# Patient Record
Sex: Female | Born: 1970 | Race: White | Hispanic: No | Marital: Married | Smoking: Former smoker
Health system: Southern US, Community
[De-identification: ages and names within clinical notes are randomized; demographics above are authoritative.]

## PROBLEM LIST (undated history)

## (undated) DIAGNOSIS — F419 Anxiety disorder, unspecified: Secondary | ICD-10-CM

## (undated) DIAGNOSIS — A6920 Lyme disease, unspecified: Secondary | ICD-10-CM

## (undated) DIAGNOSIS — R000 Tachycardia, unspecified: Secondary | ICD-10-CM

## (undated) DIAGNOSIS — E041 Nontoxic single thyroid nodule: Secondary | ICD-10-CM

## (undated) DIAGNOSIS — Z8619 Personal history of other infectious and parasitic diseases: Secondary | ICD-10-CM

## (undated) DIAGNOSIS — T7840XA Allergy, unspecified, initial encounter: Secondary | ICD-10-CM

## (undated) DIAGNOSIS — G43909 Migraine, unspecified, not intractable, without status migrainosus: Secondary | ICD-10-CM

## (undated) HISTORY — PX: TONSILLECTOMY: SUR1361

## (undated) HISTORY — PX: APPENDECTOMY: SHX54

## (undated) HISTORY — PX: STAPEDES SURGERY: SHX789

## (undated) HISTORY — DX: Migraine, unspecified, not intractable, without status migrainosus: G43.909

## (undated) HISTORY — DX: Tachycardia, unspecified: R00.0

## (undated) HISTORY — DX: Personal history of other infectious and parasitic diseases: Z86.19

## (undated) HISTORY — DX: Allergy, unspecified, initial encounter: T78.40XA

## (undated) HISTORY — DX: Nontoxic single thyroid nodule: E04.1

---

## 2013-03-06 LAB — HM PAP SMEAR: HM Pap smear: NORMAL

## 2015-05-01 ENCOUNTER — Emergency Department (HOSPITAL_COMMUNITY)

## 2015-05-01 ENCOUNTER — Emergency Department (HOSPITAL_COMMUNITY)
Admission: EM | Admit: 2015-05-01 | Discharge: 2015-05-01 | Disposition: A | Discharge status: Home / Self Care | Attending: Emergency Medicine | Admitting: Emergency Medicine

## 2015-05-01 ENCOUNTER — Encounter (HOSPITAL_COMMUNITY): Payer: Self-pay | Admitting: Oncology

## 2015-05-01 DIAGNOSIS — K30 Functional dyspepsia: Secondary | ICD-10-CM | POA: Insufficient documentation

## 2015-05-01 DIAGNOSIS — R079 Chest pain, unspecified: Secondary | ICD-10-CM | POA: Diagnosis present

## 2015-05-01 DIAGNOSIS — R000 Tachycardia, unspecified: Secondary | ICD-10-CM | POA: Insufficient documentation

## 2015-05-01 DIAGNOSIS — Z87891 Personal history of nicotine dependence: Secondary | ICD-10-CM | POA: Diagnosis not present

## 2015-05-01 DIAGNOSIS — Z79899 Other long term (current) drug therapy: Secondary | ICD-10-CM | POA: Diagnosis not present

## 2015-05-01 DIAGNOSIS — Z8659 Personal history of other mental and behavioral disorders: Secondary | ICD-10-CM | POA: Diagnosis not present

## 2015-05-01 HISTORY — DX: Anxiety disorder, unspecified: F41.9

## 2015-05-01 LAB — BASIC METABOLIC PANEL
Anion gap: 9 (ref 5–15)
BUN: 17 mg/dL (ref 6–20)
CO2: 21 mmol/L — ABNORMAL LOW (ref 22–32)
CREATININE: 0.76 mg/dL (ref 0.44–1.00)
Calcium: 10.1 mg/dL (ref 8.9–10.3)
Chloride: 106 mmol/L (ref 101–111)
Glucose, Bld: 92 mg/dL (ref 65–99)
Potassium: 4.3 mmol/L (ref 3.5–5.1)
SODIUM: 136 mmol/L (ref 135–145)

## 2015-05-01 LAB — CBC
HCT: 44.5 % (ref 36.0–46.0)
Hemoglobin: 15 g/dL (ref 12.0–15.0)
MCH: 32.2 pg (ref 26.0–34.0)
MCHC: 33.7 g/dL (ref 30.0–36.0)
MCV: 95.5 fL (ref 78.0–100.0)
PLATELETS: 374 10*3/uL (ref 150–400)
RBC: 4.66 MIL/uL (ref 3.87–5.11)
RDW: 13 % (ref 11.5–15.5)
WBC: 7 10*3/uL (ref 4.0–10.5)

## 2015-05-01 LAB — I-STAT TROPONIN, ED
TROPONIN I, POC: 0.01 ng/mL (ref 0.00–0.08)
TROPONIN I, POC: 0 ng/mL (ref 0.00–0.08)

## 2015-05-01 MED ORDER — METOPROLOL TARTRATE 25 MG PO TABS: 25.0000 mg | ORAL_TABLET | Freq: Two times a day (BID) | ORAL | Status: DC

## 2015-05-01 MED ORDER — LORAZEPAM 2 MG/ML IJ SOLN
0.5000 mg | Freq: Once | INTRAMUSCULAR | Status: AC
  Administered 2015-05-01: 0.5 mg via INTRAVENOUS
  Filled 2015-05-01: qty 1

## 2015-05-01 NOTE — ED Notes (Signed)
EKG given to EDP,Pollina,MD., for review. 

## 2015-05-01 NOTE — ED Provider Notes (Signed)
  Physical Exam  BP 139/89 mmHg  Pulse 89  Temp(Src) 98.3 F (36.8 C) (Oral)  Resp 15  Ht  (1.626 m)  Wt 72.576 kg  BMI 27.45 kg/m2  SpO2 100%  LMP 04/19/2015 (Approximate)  Physical Exam  ED Course  Procedures  MDM Sign Out from Marlon Pel, PA-C CP 1AM Palpitations  Attempted to get Rhythm ago, but back in NSR Pending Delta Trop. Attempt to get rhythm when episode returns If unable, DC with Metoprolol  PO BID with follow up to Cardiology for possible Holter  7:53 AM- Delta Trop negative. Will DC home with Metoprolol and follow up to Cardiology. Pt in NAD. VSS.         Audry Pili, PA-C 05/01/15 0820  Gilda Crease, MD 05/01/15 [phone]

## 2015-05-01 NOTE — ED Notes (Signed)
Per pt she developed indigestion at approximately 0100.  At 0300 pt began having left sided chest pain w/ radiation to neck, diaphoresis, malaise and dizziness.  Denies this happening in the past.

## 2015-05-01 NOTE — ED Notes (Signed)
md at bedside

## 2015-05-01 NOTE — ED Provider Notes (Signed)
CSN: [id number]     Arrival date & time 05/01/15  4696 History   First MD Initiated Contact with Patient 05/01/15 0357     Chief Complaint  Patient presents with  . Chest Pain     (Consider location/radiation/quality/duration/timing/severity/associated sxs/prior Treatment) HPI   Patient to the ER with complaints of indigestion sensation around 1 am this morning, she did have 3-4 glasses of wine. Around 1 am she developed indigestion and approx 4 hours later she felt as though she was getting tachycardia, pain radiating into her neck, body aches, mild chest pressure, diaphoresis. She woke up her husband who reports he could tell from feeling her chest that her heart was pounding. Therefore he brought her to the ED.She has not had any medication prior to arrival. She reports that shortly after getting into the car on the way to the ER symptoms all resolved on their own. She currently is having no symptoms. She denies at any point having shortness of breath or back pain. She denies having history of heart problems or any significant past medical history. There is history of heart disease on her father's side, she reports only with the men. She is on birth control. She denies having any lower extremity swelling, orthopnea, fever, headache, weakness, confusion, change of vision, dysphasia, aphasia, abdominal pain, nausea, vomiting, diarrhea or rash.   PCP: No primary care provider on file.  Pamela Short is a 45 y.o.  female   Past Medical History  Diagnosis Date  . Anxiety    Past Surgical History  Procedure Laterality Date  . Tonsillectomy    . Stapedes surgery     No family history on file. Social History  Substance Use Topics  . Smoking status: Former Games developer  . Smokeless tobacco: Never Used  . Alcohol Use: Yes   OB History    No data available     Review of Systems  Review of Systems All other systems negative except as documented in the HPI. All pertinent positives and  negatives as reviewed in the HPI.   Allergies  Review of patient's allergies indicates no known allergies.  Home Medications   Prior to Admission medications   Medication Sig Start Date End Date Taking? Authorizing Provider  ibuprofen (ADVIL,MOTRIN) 200 MG tablet Take 400 mg by mouth every 6 (six) hours as needed for mild pain.   Yes Historical Provider, MD  progesterone (PROMETRIUM) 100 MG capsule Take 100 mg by mouth every evening.   Yes Historical Provider, MD  metoprolol (LOPRESSOR) 25 MG tablet Take 1 tablet (25 mg total) by mouth 2 (two) times daily. 05/01/15   Audry Pili, PA-C   BP 112/70 mmHg  Pulse 79  Temp(Src) 98.3 F (36.8 C) (Oral)  Resp 15  Ht  (1.626 m)  Wt 72.576 kg  BMI 27.45 kg/m2  SpO2 100%  LMP 04/19/2015 (Approximate) Physical Exam  Constitutional: She appears well-developed and well-nourished. No distress.  HENT:  Head: Normocephalic and atraumatic.  Right Ear: Tympanic membrane and ear canal normal.  Left Ear: Tympanic membrane and ear canal normal.  Nose: Nose normal.  Mouth/Throat: Uvula is midline, oropharynx is clear and moist and mucous membranes are normal.  Eyes: Pupils are equal, round, and reactive to light.  Neck: Normal range of motion. Neck supple.  Cardiovascular: Normal rate and regular rhythm.   Pulmonary/Chest: Effort normal and breath sounds normal. She has no decreased breath sounds. She has no wheezes. She has no rhonchi. She exhibits no tenderness  and no bony tenderness.  Abdominal: Soft.  No signs of abdominal distention  Musculoskeletal:  No LE swelling  Neurological: She is alert.  Acting at baseline  Skin: Skin is warm and dry. No rash noted.  Nursing note and vitals reviewed.   ED Course  Procedures (including critical care time) Labs Review Labs Reviewed  BASIC METABOLIC PANEL - Abnormal; Notable for the following:    CO2 21 (*)    All other components within normal limits  CBC  I-STAT TROPOININ, ED  Rosezena Sensor, ED    Imaging Review Dg Chest 2 View  05/01/2015  CLINICAL DATA:  Acute onset of left-sided chest pain and palpitations. Initial encounter. EXAM: CHEST  2 VIEW COMPARISON:  None. FINDINGS: The lungs are well-aerated and clear. There is no evidence of focal opacification, pleural effusion or pneumothorax. The heart is normal in size; the mediastinal contour is within normal limits. No acute osseous abnormalities are seen. IMPRESSION: No acute cardiopulmonary process seen. Electronically Signed   By: Roanna Raider M.D.   On: 05/01/2015 04:10   I have personally reviewed and evaluated these images and lab results as part of my medical decision-making.   EKG Interpretation   Date/Time:  Saturday May 01 2015 03:44:44 EST Ventricular Rate:  79 PR Interval:  143 QRS Duration: 94 QT Interval:  374 QTC Calculation: 429 R Axis:   68 Text Interpretation:  Sinus rhythm Consider right atrial enlargement Low  voltage, precordial leads Baseline wander in lead(s) III aVL No previous  tracing Confirmed by POLLINA  MD, CHRISTOPHER ([id number]) on 05/01/2015 3:56:13  AM      MDM   Final diagnoses:  Chest pain, unspecified chest pain type    Patient symptoms are very atypical for cardiac chest pain. Question whether patient had a tachycardic arrhythmia that resolved spontaneously. The ER today she is in sinus rhythm. I discussed the case with Dr. Blinda Leatherwood, we will obtain chest x-ray, CBC, CMP and troponin. If her workup is all essentially normal then she will need to get a second troponin and be monitored in the meantime. At the end of shift patient hand off to Audry Pili, PA-C.  Filed Vitals:   05/01/15 0545 05/01/15 0736  BP: 139/89 112/70  Pulse: 89 79  Temp: 98.3 F (36.8 C)   Resp: 15 [address], PA-C 05/01/15 2141  Gilda Crease, MD 05/02/15 [phone]

## 2015-05-01 NOTE — ED Provider Notes (Signed)
Patient presented to the ER with palpitations. Patient reports that she was awakened from sleep with left-sided chest pain that radiated to her neck. Symptoms lasted for approximately an hour. She has been expressing intermittent episodes of rapid heartbeat since then. She does report a previous history of anxiety and panic attacks, but has not had an episode in several years and this does not feel similar.  Face to face Exam: HEENT - PERRLA Lungs - CTAB Heart - RRR, no M/R/G Abd - S/NT/ND Neuro - alert, oriented x3  Plan: Patient monitored in the ER, has had some sinus tachycardia, but no other significant arrhythmia. Will obtain delta troponin and if normal, follow-up with cardiology as an outpatient for further workup of possible arrhythmia.  Gilda Crease, MD 05/01/15 [phone]

## 2015-05-01 NOTE — Discharge Instructions (Signed)
Please read and follow all provided instructions.  Your diagnoses today include:  1. Chest pain, unspecified chest pain type    Tests performed today include:  An EKG of your heart  A chest x-ray  Cardiac enzymes - a blood test for heart muscle damage  Blood counts and electrolytes  Vital signs. See below for your results today.   Medications prescribed:   Take any prescribed medications if you feel the palpitations again. If so, take as prescribed afterwards.   Follow-up instructions: Please follow-up with Cardiology listed above. Call and make an appointment    Return instructions:  SEEK IMMEDIATE MEDICAL ATTENTION IF:  You have severe chest pain, especially if the pain is crushing or pressure-like and spreads to the arms, back, neck, or jaw, or if you have sweating, nausea (feeling sick to your stomach), or shortness of breath. THIS IS AN EMERGENCY. Don't wait to see if the pain will go away. Get medical help at once. Call 911 or 0 (operator). DO NOT drive yourself to the hospital.   Your chest pain gets worse and does not go away with rest.   You have an attack of chest pain lasting longer than usual, despite rest and treatment with the medications your caregiver has prescribed.   You wake from sleep with chest pain or shortness of breath.  You feel dizzy or faint.  You have chest pain not typical of your usual pain for which you originally saw your caregiver.   You have any other emergent concerns regarding your health.  Additional Information: Chest pain comes from many different causes. Your caregiver has diagnosed you as having chest pain that is not specific for one problem, but does not require admission.  You are at low risk for an acute heart condition or other serious illness.   Your vital signs today were: BP 139/89 mmHg   Pulse 89   Temp(Src) 98.3 F (36.8 C) (Oral)   Resp 15   Ht  (1.626 m)   Wt 72.576 kg   BMI 27.45 kg/m2   SpO2 100%   LMP  04/19/2015 (Approximate) If your blood pressure (BP) was elevated above 135/85 this visit, please have this repeated by your doctor within one month. --------------

## 2015-05-05 ENCOUNTER — Ambulatory Visit (INDEPENDENT_AMBULATORY_CARE_PROVIDER_SITE_OTHER): Admitting: Internal Medicine

## 2015-05-05 ENCOUNTER — Encounter: Payer: Self-pay | Admitting: Internal Medicine

## 2015-05-05 ENCOUNTER — Ambulatory Visit

## 2015-05-05 VITALS — BP 106/76 | HR 72 | Ht 64.0 in | Wt 161.0 lb

## 2015-05-05 DIAGNOSIS — G478 Other sleep disorders: Secondary | ICD-10-CM | POA: Diagnosis not present

## 2015-05-05 DIAGNOSIS — R002 Palpitations: Secondary | ICD-10-CM

## 2015-05-05 NOTE — Patient Instructions (Signed)
Your physician has requested that you have an echocardiogram @ [address] - [address]. Echocardiography is a painless test that uses sound waves to create images of your heart. It provides your doctor with information about the size and shape of your heart and how well your heart's chambers and valves are working. This procedure takes approximately one hour. There are no restrictions for this procedure.  Your physician has recommended that you wear an event monitor for 7 days. Event monitors are medical devices that record the heart's electrical activity. Doctors most often [place] these monitors to diagnose arrhythmias. Arrhythmias are problems with the speed or rhythm of the heartbeat. The monitor is a small, portable device. You can wear one while you do your normal daily activities. This is usually used to diagnose what is causing palpitations/syncope (passing out).  Your physician recommends that you schedule a follow-up appointment with Dr. Rennis Golden after your testing    TIPS -  REMINDERS for monitor 1. The sensor is the lanyard that is worn around your neck every day - this is powered by a battery that needs to be changed every day 2. The monitor is the device that allows you to record symptoms - this will need to be charged daily 3. The sensor & monitor need to be within 100 feet of each other at all times 4. The sensor connects to the electrodes (stickers) - these should be changed every 24-48 hours (you do not have to remove them when you bathe, just make sure they are dry when you connect it back to the sensor 5. If you need more supplies (electrodes, batteries), please call the 1-800 # on the back of the pamphlet and [employer] will mail you more supplies 6. If your skin becomes sensitive, please try the sample pack of sensitive skin electrodes (the white packet in your silver box) and call [employer] to have them mail you more of these type of electrodes 7. When you are finish wearing  the monitor, please place all supplies back in the silver box, place the silver box in the pre-packaged UPS bag and drop off at [employer] or call them so they can come pick it up   Cardiac Event Monitoring A cardiac event monitor is a small recording device used to help detect abnormal heart rhythms (arrhythmias). The monitor is used to record heart rhythm when noticeable symptoms such as the following occur:  Fast heartbeats (palpitations), such as heart racing or fluttering.  Dizziness.  Fainting or light-headedness.  Unexplained weakness. The monitor is wired to two electrodes placed on your chest. Electrodes are flat, sticky disks that attach to your skin. The monitor can be worn for up to 30 days. You will wear the monitor at all times, except when bathing.  HOW TO USE YOUR CARDIAC EVENT MONITOR A technician will prepare your chest for the electrode placement. The technician will show you how to place the electrodes, how to work the monitor, and how to replace the batteries. Take time to practice using the monitor before you leave the office. Make sure you understand how to send the information from the monitor to your health care provider. This requires a telephone with a landline, not a cell phone. You need to:  Wear your monitor at all times, except when you are in water:  Do not get the monitor wet.  Take the monitor off when bathing. Do not swim or use a hot tub with it on.  Keep your  skin clean. Do not put body lotion or moisturizer on your chest.  Change the electrodes daily or any time they stop sticking to your skin. You might need to use tape to keep them on.  It is possible that your skin under the electrodes could become irritated. To keep this from happening, try to put the electrodes in slightly different places on your chest. However, they must remain in the area under your left breast and in the upper right section of your chest.  Make sure the monitor is safely clipped to  your clothing or in a location close to your body that your health care provider recommends.  Press the button to record when you feel symptoms of heart trouble, such as dizziness, weakness, light-headedness, palpitations, thumping, shortness of breath, unexplained weakness, or a fluttering or racing heart. The monitor is always on and records what happened slightly before you pressed the button, so do not worry about being too late to get good information.  Keep a diary of your activities, such as walking, doing chores, and taking medicine. It is especially important to note what you were doing when you pushed the button to record your symptoms. This will help your health care provider determine what might be contributing to your symptoms. The information stored in your monitor will be reviewed by your health care provider alongside your diary entries.  Send the recorded information as recommended by your health care provider. It is important to understand that it will take some time for your health care provider to process the results.  Change the batteries as recommended by your health care provider. SEEK IMMEDIATE MEDICAL CARE IF:   You have chest pain.  You have extreme difficulty breathing or shortness of breath.  You develop a very fast heartbeat that persists.  You develop dizziness that does not go away.  You faint or constantly feel you are about to faint. Document Released: 11/30/2007 Document Revised: 07/07/2013 Document Reviewed: 08/19/2012 [facility] Patient Information 2015 [place], [place]. This information is not intended to replace advice given to you by your health care provider. Make sure you discuss any questions you have with your health care provider.

## 2015-05-05 NOTE — Progress Notes (Signed)
OFFICE NOTE  Chief Complaint:  Palpitations  Primary Care Physician: No PCP Per Patient  HPI:  Pamela Short is a pleasant 45 year old female who is in the process of moving to [place] from the [facility] area. Several days ago she had noticed an increase in awareness of her heart and palpitations. She had several glasses of wine after dinner and went to bed. She was awakened early in the morning with a feeling of her heart racing. She had some pressure in her heart and felt like she could not take a deep breath. She also became mildly diaphoretic. She thought this was a panic attack. She's had panic attacks in the distant past but she said was related to some sort of adrenal problem. She went on to progesterone and started taking vitamin D and her symptoms improved. She also took some Xanax which helped. She ultimately presented to the ER. She ruled out for MI and was noted to have paroxysmal tachycardia. I reviewed her ER EKGs which shows normal sinus rhythm without clear evidence of preexcitation or accessory pathway or short PR interval. There was 1 ER rhythm strip which demonstrated a heart rate of 142 and it appears to be either sinus or atrial tachycardia. Apparently these episodes cause her to feel like she had to have a bowel movement. She did actually have a bowel movement in the ER and noted her heart rate improved, suggesting a vaguely responsive rhythm and possible AVNRT. Since that time she was discharged, she was given a prescription for metoprolol. She says she has not started taking that medicine. It was for 25 mg twice daily although her blood pressure at rest is only 106/76. She still having some episodes although not as severe as she had the night that she is presented. She has tried to decrease wine intake. She is not a caffeine user. She is generally healthy and exercises regularly. She is under significant stress with her recent move. Coronary artery diseases in her family  although her parents are both obese.  PMHx:  Past Medical History  Diagnosis Date  . Anxiety     Past Surgical History  Procedure Laterality Date  . Tonsillectomy    . Stapedes surgery      FAMHx:  Family History  Problem Relation Age of Onset  . Heart disease      father side of family  . Transient ischemic attack Father   . Heart Problems      father's side  . Cancer Maternal Grandmother     SOCHx:   reports that she has quit smoking. She has never used smokeless tobacco. She reports that she drinks alcohol. She reports that she does not use illicit drugs.  ALLERGIES:  No Known Allergies  ROS: Pertinent items noted in HPI and remainder of comprehensive ROS otherwise negative.  HOME MEDS: Current Outpatient Prescriptions  Medication Sig Dispense Refill  . Cholecalciferol (VITAMIN D-3 PO) Take 1 tablet by mouth daily.    . NON FORMULARY Bio-Hormone Replacement Therapy Vitamins.  Daily by mouth.    Marland Kitchen ibuprofen (ADVIL,MOTRIN) 200 MG tablet Take 400 mg by mouth every 6 (six) hours as needed for mild pain.    . metoprolol (LOPRESSOR) 25 MG tablet Take 1 tablet (25 mg total) by mouth 2 (two) times daily. 30 tablet 0  . progesterone (PROMETRIUM) 100 MG capsule Take 100 mg by mouth daily as needed.      No current facility-administered medications for this visit.  LABS/IMAGING: No results found for this or any previous visit (from the past 48 hour(s)). No results found.  WEIGHTS: Wt Readings from Last 3 Encounters:  05/05/15 161 lb (73.029 kg)  05/01/15 160 lb (72.576 kg)    VITALS: BP 106/76 mmHg  Pulse 72  Ht  (1.626 m)  Wt 161 lb (73.029 kg)  BMI 27.62 kg/m2  LMP 04/19/2015 (Approximate)  EXAM: General appearance: alert and Mildly anxious, tearful Neck: no carotid bruit and no JVD Lungs: clear to auscultation bilaterally Heart: regular rate and rhythm, S1, S2 normal, no murmur, click, rub or gallop Abdomen: soft, non-tender; bowel sounds  normal; no masses,  no organomegaly Extremities: extremities normal, atraumatic, no cyanosis or edema Pulses: 2+ and symmetric Skin: Skin color, texture, turgor normal. No rashes or lesions Neurologic: Grossly normal Psych: Pleasant  EKG: I personally reviewed the ER EKGs and rhythm strips. The EKG demonstrates normal sinus rhythm without preexcitation or evidence of accessory pathway. The rhythm strip demonstrates a tachycardia at a rate of 142, and appears to be of the sinus or atrial tachycardia.  ASSESSMENT: 1. 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 Poor sleep 3. Fatigue   PLAN: 1.   Ms. Schweppe is describing some palpitations and tachycardia with possible reentry arrhythmia. Her EKG at rest is not helpful and her rhythm strip does not capture the onset and offset of her arrhythmia. She still having symptoms and not on any treatment this time. I advised her to not take her metoprolol unless she has an episode the last more than 30 minutes at which time she can take a half of the metoprolol tartrate tablet (12.5 mg daily). We will go ahead and place her on a one-week monitor today to see if we can capture any of her arrhythmias. She may need electrophysiology consult. I recommend an echocardiogram to rule out any structural heart disease as a possible cause of this.  Plan to see her back to discuss his findings in a few weeks.  Chrystie Nose, MD, [facility] Attending Cardiologist [facility]  Chrystie Nose 05/05/2015, 9:53 AM

## 2015-05-08 ENCOUNTER — Other Ambulatory Visit: Payer: Self-pay | Admitting: Physician Assistant

## 2015-05-08 DIAGNOSIS — F419 Anxiety disorder, unspecified: Secondary | ICD-10-CM

## 2015-05-08 MED ORDER — ALPRAZOLAM 0.25 MG PO TABS: 0.2500 mg | ORAL_TABLET | Freq: Every evening | ORAL | Status: DC | PRN

## 2015-05-08 NOTE — Progress Notes (Signed)
The patient called into reports a panic attack/ palpitations at 0200hrs this morning.  I called [facility] and her rhythm was sinus. i prescribed Xanax 0.25mg , 5 tablets with no refills.   Follow up with PCP, which she does not have yet since she just moved here.  Wilburt FinlayHAGER, Lucille Crichlow PAC

## 2015-05-11 DIAGNOSIS — R002 Palpitations: Secondary | ICD-10-CM | POA: Diagnosis not present

## 2015-05-16 ENCOUNTER — Observation Stay (HOSPITAL_COMMUNITY)
Admission: EM | Admit: 2015-05-16 | Discharge: 2015-05-18 | Disposition: A | Discharge status: Home / Self Care | Attending: Cardiology | Admitting: Cardiology

## 2015-05-16 ENCOUNTER — Encounter (HOSPITAL_COMMUNITY): Payer: Self-pay | Admitting: Emergency Medicine

## 2015-05-16 ENCOUNTER — Emergency Department (HOSPITAL_COMMUNITY)

## 2015-05-16 ENCOUNTER — Telehealth: Payer: Self-pay | Admitting: Physician Assistant

## 2015-05-16 ENCOUNTER — Telehealth: Payer: Self-pay | Admitting: Internal Medicine

## 2015-05-16 DIAGNOSIS — G478 Other sleep disorders: Secondary | ICD-10-CM | POA: Diagnosis present

## 2015-05-16 DIAGNOSIS — R002 Palpitations: Secondary | ICD-10-CM | POA: Diagnosis not present

## 2015-05-16 DIAGNOSIS — Z87891 Personal history of nicotine dependence: Secondary | ICD-10-CM | POA: Insufficient documentation

## 2015-05-16 DIAGNOSIS — Z8249 Family history of ischemic heart disease and other diseases of the circulatory system: Secondary | ICD-10-CM | POA: Diagnosis not present

## 2015-05-16 DIAGNOSIS — I10 Essential (primary) hypertension: Secondary | ICD-10-CM | POA: Insufficient documentation

## 2015-05-16 DIAGNOSIS — R000 Tachycardia, unspecified: Secondary | ICD-10-CM | POA: Diagnosis not present

## 2015-05-16 DIAGNOSIS — R0602 Shortness of breath: Secondary | ICD-10-CM | POA: Insufficient documentation

## 2015-05-16 DIAGNOSIS — R079 Chest pain, unspecified: Secondary | ICD-10-CM | POA: Diagnosis not present

## 2015-05-16 DIAGNOSIS — F419 Anxiety disorder, unspecified: Secondary | ICD-10-CM | POA: Diagnosis not present

## 2015-05-16 LAB — BASIC METABOLIC PANEL
Anion gap: 9 (ref 5–15)
BUN: 6 mg/dL (ref 6–20)
CALCIUM: 10.9 mg/dL — AB (ref 8.9–10.3)
CHLORIDE: 109 mmol/L (ref 101–111)
CO2: 23 mmol/L (ref 22–32)
CREATININE: 0.74 mg/dL (ref 0.44–1.00)
GFR calc Af Amer: >60 mL/min (ref >60)
GFR calc non Af Amer: >60 mL/min (ref >60)
Glucose, Bld: 148 mg/dL — ABNORMAL HIGH (ref 65–99)
Potassium: 4.3 mmol/L (ref 3.5–5.1)
Sodium: 141 mmol/L (ref 135–145)

## 2015-05-16 LAB — I-STAT TROPONIN, ED: Troponin i, poc: 0 ng/mL (ref 0.00–0.08)

## 2015-05-16 LAB — URINALYSIS, ROUTINE W REFLEX MICROSCOPIC
Bilirubin Urine: NEGATIVE
GLUCOSE, UA: NEGATIVE mg/dL
Hgb urine dipstick: NEGATIVE
KETONES UR: 15 mg/dL — AB
LEUKOCYTES UA: NEGATIVE
NITRITE: NEGATIVE
PH: 6 (ref 5.0–8.0)
Protein, ur: NEGATIVE mg/dL
SPECIFIC GRAVITY, URINE: 1.014 (ref 1.005–1.030)

## 2015-05-16 LAB — CBC
HEMATOCRIT: 43.1 % (ref 36.0–46.0)
Hemoglobin: 14.4 g/dL (ref 12.0–15.0)
MCH: 31.3 pg (ref 26.0–34.0)
MCHC: 33.4 g/dL (ref 30.0–36.0)
MCV: 93.7 fL (ref 78.0–100.0)
Platelets: 315 10*3/uL (ref 150–400)
RBC: 4.6 MIL/uL (ref 3.87–5.11)
RDW: 13 % (ref 11.5–15.5)
WBC: 8.1 10*3/uL (ref 4.0–10.5)

## 2015-05-16 LAB — D-DIMER, QUANTITATIVE: D-Dimer, Quant: 0.31 ug/mL-FEU (ref 0.00–0.50)

## 2015-05-16 NOTE — ED Provider Notes (Addendum)
CSN: [id number]     Arrival date & time 05/16/15  1904 History   First MD Initiated Contact with Patient 05/16/15 2120     Chief Complaint  Patient presents with  . Chest Pain     (Consider location/radiation/quality/duration/timing/severity/associated sxs/prior Treatment) HPI Comments: Patient is a 45 year old female presenting today for persistent symptoms that started approximately 3 weeks ago that include intermittent palpitations, shortness of breath, nausea, sweating. She saw cardiology last week for these symptoms and at that time they were concerned she may be having dysrhythmias. Patient states the episodes have been intermittent but she traveled to [place] this week and they were happening recurrently so she decided to come home today. She states they're usually worse at night but she's had them throughout the day today. She was monitoring her pulse and it went up to 147 and usually after 20-30 minutes her heart rate will improve. She states she just does not feel herself and she has never had this issue prior to the last 3 weeks. She does note that approximately one month ago she and her husband drove to [place] and back and these episodes started shortly after that. Patient had noted some left leg pain in the last few weeks that has resolved in the last few days. She does take a progesterone derivative but denies any estrogen use.  She has no prior history of DVT or cardiac disease. She has never had a history of hypertension or thyroid disease. She sees a doctor regularly. No fever, cough or URI sx. Dr. Tresa EndoKelly with cardiology saw the patient while she was in triage and request that she was seen and they would be happy to admit her if there is no other findings.  Patient is a 45 y.o. female presenting with chest pain. The history is provided by the patient.  Chest Pain   Past Medical History  Diagnosis Date  . Anxiety    Past Surgical History  Procedure Laterality Date  .  Tonsillectomy    . Stapedes surgery    . Appendectomy     Family History  Problem Relation Age of Onset  . Heart disease      father side of family  . Transient ischemic attack Father   . Heart Problems      father's side  . Cancer Maternal Grandmother    Social History  Substance Use Topics  . Smoking status: Former Games developermoker  . Smokeless tobacco: Never Used  . Alcohol Use: Yes   OB History    No data available     Review of Systems  Cardiovascular: Positive for chest pain.  All other systems reviewed and are negative.     Allergies  Review of patient's allergies indicates no known allergies.  Home Medications   Prior to Admission medications   Medication Sig Start Date End Date Taking? Authorizing Provider  Cholecalciferol (VITAMIN D-3 PO) Take 1 tablet by mouth daily.   Yes Historical Provider, MD  ibuprofen (ADVIL,MOTRIN) 200 MG tablet Take 400 mg by mouth every 6 (six) hours as needed for mild pain.   Yes Historical Provider, MD  Multiple Vitamin (MULTIVITAMIN WITH MINERALS) TABS tablet Take 1 tablet by mouth daily.   Yes Historical Provider, MD  NON FORMULARY Take 1 capsule by mouth every evening. Bio-Hormone Replacement Therapy Vitamins Progesterone equivalent   Yes Historical Provider, MD  Omega-3 Fatty Acids (FISH OIL) 1000 MG CAPS Take 2,000 mg by mouth daily.   Yes Historical Provider, MD  ALPRAZolam (  XANAX) 0.25 MG tablet Take 1 tablet (0.25 mg total) by mouth at bedtime as needed for anxiety. 05/08/15   Dwana Melena, PA-C  metoprolol (LOPRESSOR) 25 MG tablet Take 1 tablet (25 mg total) by mouth 2 (two) times daily. Patient taking differently: Take 25 mg by mouth daily as needed (for SVT).  05/01/15   Audry Pili, PA-C   BP 115/78 mmHg  Pulse 61  Temp(Src) 99.2 F (37.3 C) (Oral)  Resp 13  SpO2 100%  LMP 04/19/2015 (Approximate) Physical Exam  Constitutional: She is oriented to person, place, and time. She appears well-developed and well-nourished. No  distress.  HENT:  Head: Normocephalic and atraumatic.  Mouth/Throat: Oropharynx is clear and moist.  Eyes: Conjunctivae and EOM are normal. Pupils are equal, round, and reactive to light.  Neck: Normal range of motion. Neck supple.  Cardiovascular: Normal rate, regular rhythm and intact distal pulses.   No murmur heard. Pulmonary/Chest: Effort normal and breath sounds normal. No respiratory distress. She has no wheezes. She has no rales.  Abdominal: Soft. She exhibits no distension. There is no tenderness. There is no rebound and no guarding.  Musculoskeletal: Normal range of motion. She exhibits no edema or tenderness.  No notable tenderness in the lower ext or swelling  Neurological: She is alert and oriented to person, place, and time.  Skin: Skin is warm and dry. No rash noted. No erythema.  Psychiatric: She has a normal mood and affect. Her behavior is normal.  Nursing note and vitals reviewed.   ED Course  Procedures (including critical care time) Labs Review Labs Reviewed  BASIC METABOLIC PANEL - Abnormal; Notable for the following:    Glucose, Bld 148 (*)    Calcium 10.9 (*)    All other components within normal limits  CBC  D-DIMER, QUANTITATIVE (NOT AT [facility])  TSH  URINALYSIS, ROUTINE W REFLEX MICROSCOPIC (NOT AT [facility])  METANEPHRINES, PLASMA  CORTISOL  I-STAT TROPOININ, ED    Imaging Review Dg Chest 2 View  05/16/2015  CLINICAL DATA:  Heart racing. EXAM: CHEST  2 VIEW COMPARISON:  05/01/2015 FINDINGS: The heart size and mediastinal contours are within normal limits. Both lungs are clear. The visualized skeletal structures are unremarkable. IMPRESSION: No active cardiopulmonary disease. Electronically Signed   By: Kennith Center M.D.   On: 05/16/2015 20:00   I have personally reviewed and evaluated these images and lab results as part of my medical decision-making.   EKG Interpretation   Date/Time:  Sunday May 16 2015 19:08:30 EDT Ventricular Rate:  92 PR  Interval:  138 QRS Duration: 88 QT Interval:  332 QTC Calculation: 410 R Axis:   67 Text Interpretation:  Normal sinus rhythm Nonspecific ST abnormality No  significant change since last tracing Confirmed by Anitra Lauth  MD, Alphonzo Lemmings  [phone]) on 05/16/2015 9:19:08 PM      MDM   Final diagnoses:  Palpitation   Patient is a 45 year old healthy female who has had 3 weeks of intermittent palpitations, diaphoresis, shortness of breath and chest pain.  Patient was seen by cardiology last week and is wearing a Holter monitor but symptoms were worsening and she was encouraged to come to the emergency room tonight. Currently patient is asymptomatic but states symptoms happen intermittently. She has recently had some long travel in the last month but does not take estrogens. Concern that patient's symptoms could be PE so a d-dimer was done. CBC, troponin, BMP were all within normal limits and EKG without acute findings.  TSH also done to evaluate for further issues.  12:04 AM D-dimer is neg.  TSH pending.  Pt had an episode of tachy cardia here to the 160's that was SR.  Pt desiring to stay.    Gwyneth Sprout, MD 05/17/15 0005  Gwyneth Sprout, MD 05/17/15 1610

## 2015-05-16 NOTE — Telephone Encounter (Signed)
Ms. Pamela Short called because she was having additional palpitations. She said they made her a little bit lightheaded time. She has had several episodes today. She is in the process of flying back from [place]. Her symptoms have currently resolved.  I advised that if she is not currently having symptoms, and has not had syncope, she does not have to go to the closest emergency room. She is advised that her daughter was picking her up from the airport, and she will not be driving herself. I requested that she track her heart rate and let us know what it is. Per Dr. Blanchie DessertHilty's note, she recently wore an event monitor, but she did not mention that she still had it on. She may have already removed it.  I requested that she contact us for additional symptoms and try to keep a record of how long they are lasting and how often they are occurring. I requested that she contact us when she gets back [place] if she is still having problems. The patient stated she would do so.  Pamela Short, Pamela Duce, PA-C 05/16/2015 5:39 PM Beeper [phone]

## 2015-05-16 NOTE — ED Notes (Signed)
Pt. reports intermittent central chest tightness with palpitations , SOB , nausea and diaphoresis onset 2am this morning .

## 2015-05-16 NOTE — ED Notes (Signed)
Nurse explained process, waiting time and no. of patients at waiting area to pt. and family . Pt. is upset with nurse after encounter.

## 2015-05-16 NOTE — ED Notes (Signed)
Printed 2 "episodes" of pt's heart rate spiking, recordings given to Dr. Anitra LauthPlunkett.

## 2015-05-16 NOTE — Consult Note (Signed)
Reason for Consult: palpitations Primary Cardiologist: Dr. Debara Pickett Referring Physician: Dr. Royston Bake Short is an 45 y.o. female.  HPI: Pamela Short is a 45 yo woman with limited PMH who was seen in [facility] ER in February and subsequently by Dr. Debara Pickett on 05/05/15 who presented to our ER with symptoms of feeling terrible and intermittent palpitations, tachycardia and induced bowel movement. She has been having symptoms for 1-2 months. She is afraid of going to sleep at night because she is fearful she will wake up with acute inability to catch her breath. She was in [place] this morning but overnight (Saturday evening) she felt terrible with similar sensations of palpitations, faster heart-rate, inability to catch/take a deep breath, hypertension and warm sensation. In her previous Er visit, she had negative troponins, fairly normal ECGs without accessory pathway/short PR interval and one rhythm strip (I cannot find this but per dr. Lysbeth Penner note) with HR to 152 with sinus vs. Atrial tachycardia. She endorses need to have a bowel movement at the same time. Today she flew from [place] to [place] and drove to [employer] (she called en route with husband driving) to alert [place] of her symptoms. She's tolerating diet, has no exercise intolerance, works full-time and no weight loss/weight gain. She had an event monitor but has it in her car (needs to turn it in) and she has an echocardiogram planned for Tuesday. She doesn't drink caffeine, exercises and has CAD in her father.       Past Medical History  Diagnosis Date  . Anxiety     Past Surgical History  Procedure Laterality Date  . Tonsillectomy    . Stapedes surgery    . Appendectomy      Family History  Problem Relation Age of Onset  . Heart disease      father side of family  . Transient ischemic attack Father   . Heart Problems      father's side  . Cancer Maternal Grandmother     Social History:  reports that she has quit smoking. She  has never used smokeless tobacco. She reports that she drinks alcohol. She reports that she does not use illicit drugs.  Allergies: No Known Allergies  Medications: I have reviewed the patient's current medications. Prior to Admission:  (Not in a hospital admission) Scheduled:  Results for orders placed or performed during the hospital encounter of 05/16/15 (from the past 48 hour(s))  Basic metabolic panel     Status: Abnormal   Collection Time: 05/16/15  7:13 PM  Result Value Ref Range   Sodium 141 135 - 145 mmol/L   Potassium 4.3 3.5 - 5.1 mmol/L   Chloride 109 101 - 111 mmol/L   CO2 23 22 - 32 mmol/L   Glucose, Bld 148 (H) 65 - 99 mg/dL   BUN 6 6 - 20 mg/dL   Creatinine, Ser 0.74 0.44 - 1.00 mg/dL   Calcium 10.9 (H) 8.9 - 10.3 mg/dL   GFR calc non Af Amer >60 >60 mL/min   GFR calc Af Amer >60 >60 mL/min    Comment: (NOTE) The eGFR has been calculated using the CKD EPI equation. This calculation has not been validated in all clinical situations. eGFR's persistently <60 mL/min signify possible Chronic Kidney Disease.    Anion gap 9 5 - 15  CBC     Status: None   Collection Time: 05/16/15  7:13 PM  Result Value Ref Range   WBC 8.1 4.0 -  10.5 K/uL   RBC 4.60 3.87 - 5.11 MIL/uL   Hemoglobin 14.4 12.0 - 15.0 g/dL   HCT 43.1 36.0 - 46.0 %   MCV 93.7 78.0 - 100.0 fL   MCH 31.3 26.0 - 34.0 pg   MCHC 33.4 30.0 - 36.0 g/dL   RDW 13.0 11.5 - 15.5 %   Platelets 315 150 - 400 K/uL  I-stat troponin, ED (not at [facility])     Status: None   Collection Time: 05/16/15  7:31 PM  Result Value Ref Range   Troponin i, poc 0.00 0.00 - 0.08 ng/mL   Comment 3            Comment: Due to the release kinetics of cTnI, a negative result within the first hours of the onset of symptoms does not rule out myocardial infarction with certainty. If myocardial infarction is still suspected, repeat the test at appropriate intervals.     Dg Chest 2 View  05/16/2015  CLINICAL DATA:  Heart racing.  EXAM: CHEST  2 VIEW COMPARISON:  05/01/2015 FINDINGS: The heart size and mediastinal contours are within normal limits. Both lungs are clear. The visualized skeletal structures are unremarkable. IMPRESSION: No active cardiopulmonary disease. Electronically Signed   By: Misty Stanley M.D.   On: 05/16/2015 20:00    Review of Systems  Constitutional: Negative for chills, weight loss, malaise/fatigue and diaphoresis.  HENT: Negative for ear discharge, ear pain and nosebleeds.   Eyes: Negative for blurred vision, double vision and photophobia.  Respiratory: Positive for shortness of breath. Negative for cough, hemoptysis and sputum production.   Cardiovascular: Positive for chest pain and palpitations. Negative for claudication and leg swelling.  Gastrointestinal: Negative for heartburn, nausea, vomiting, diarrhea, constipation, blood in stool and melena.  Genitourinary: Negative for dysuria and urgency.  Musculoskeletal: Negative for myalgias, back pain and neck pain.  Neurological: Negative for dizziness, tingling, tremors and headaches.  Endo/Heme/Allergies: Negative for polydipsia. Does not bruise/bleed easily.  Psychiatric/Behavioral: Negative for suicidal ideas, hallucinations and substance abuse. The patient is nervous/anxious.    Blood pressure 126/87, pulse 66, temperature 99.2 F (37.3 C), temperature source Oral, resp. rate 13, last menstrual period 04/19/2015, SpO2 99 %. Physical Exam  Nursing note and vitals reviewed. Constitutional: She is oriented to person, place, and time. She appears well-developed and well-nourished. No distress.  HENT:  Head: Normocephalic and atraumatic.  Nose: Nose normal.  Mouth/Throat: Oropharynx is clear and moist. No oropharyngeal exudate.  Eyes: Conjunctivae and EOM are normal. Pupils are equal, round, and reactive to light. No scleral icterus.  Neck: Normal range of motion. Neck supple. No JVD present. No tracheal deviation present.  Cardiovascular:  Normal rate, regular rhythm, normal heart sounds and intact distal pulses.  Exam reveals no gallop.   No murmur heard. Respiratory: Effort normal and breath sounds normal. No respiratory distress. She has no wheezes. She has no rales.  GI: Soft. Bowel sounds are normal. She exhibits no distension. There is no tenderness.  Musculoskeletal: Normal range of motion. She exhibits no edema or tenderness.  Neurological: She is alert and oriented to person, place, and time. No cranial nerve deficit. Coordination normal.  Skin: Skin is warm and dry. No rash noted. She is not diaphoretic. No erythema.  Psychiatric: She has a normal mood and affect. Her behavior is normal. Thought content normal.   Labs reviewed; K 4.3, creatinine 0.7, glucose 148, trop 0.00, wbc 8.1, h/h 14.4/43.1 Chest x-ray: no acute process EKG: NSR  Assessment/Plan:  Ms. Maraya Gwilliam is a 45 yo woman with limited PMH - panic attacks, seen recently by Dr. Debara Pickett for palpitations who presents to ER with feeling terrible, worse at night, palpitations. Differential diagnosis is broad including atrial fibrillation, atrial tachycardia, thyroid disease, adrenal disorder, pheochromocytoma, other endocrine abnormality, hypertension, stress, panic attacks among many other etiologies. I discussed large gamut of possibilities and likelihood that this will take some time to firm up a diagnosis. She has pending further evaluation in the ER but anticipate observation vs. Close clinic follow-up. Echocardiogram tomorrow or Tuesday AM, review event monitor, obtain thyroid studies, BNP, urine metanephrines, ? Ambulatory blood pressure monitor, consider sleep study. Based on results of these studies further studies may be needed. Continue healthy diet and aerobic exercise according to [facility] guidelines.  Problem List 1. Palpitations 2. Breathlessness 3. Anxiety 4. Hypertension  Plan 1. Telemetry in ER, review event monitor 2. Echocardiogram Monday or  Tuesday 3. TSH, BNP, urine metanephrines, lipid panel, consider cortisol stimulation test if cortisol level normal 4. Urinalysis  Katisha Shimizu 05/16/2015, 10:17 PM

## 2015-05-16 NOTE — Telephone Encounter (Signed)
Ms. Pamela Short woke up in [place] this AM feeling terrible - palpitations, increased urge to have bowel movement, intermittently faster HRs, [place] 3 hours and now is driving home. She feels terrible and someone else is driving. I recommended going to closest ER - [facility], [facility], [facility] given she feels terrible but she prefers to come to [place]. I told her if she feels worse to go to closest ER. It sounds like she's having palpitations but I cannot rule out infectious symptoms, other triggers, pulmonary embolism. She needs evaluation in the ER given symptoms but would not limit evaluation to arrhythmia given her generalized feeling of uneasiness.   Leeann MustJacob Shanty Ginty, MD

## 2015-05-17 ENCOUNTER — Encounter (HOSPITAL_COMMUNITY): Payer: Self-pay | Admitting: Internal Medicine

## 2015-05-17 ENCOUNTER — Observation Stay (HOSPITAL_BASED_OUTPATIENT_CLINIC_OR_DEPARTMENT_OTHER)

## 2015-05-17 DIAGNOSIS — R0602 Shortness of breath: Secondary | ICD-10-CM | POA: Insufficient documentation

## 2015-05-17 DIAGNOSIS — I10 Essential (primary) hypertension: Secondary | ICD-10-CM | POA: Diagnosis not present

## 2015-05-17 DIAGNOSIS — R002 Palpitations: Secondary | ICD-10-CM

## 2015-05-17 DIAGNOSIS — R000 Tachycardia, unspecified: Secondary | ICD-10-CM

## 2015-05-17 DIAGNOSIS — I4711 Inappropriate sinus tachycardia, so stated: Secondary | ICD-10-CM

## 2015-05-17 LAB — CORTISOL: CORTISOL PLASMA: 2.8 ug/dL

## 2015-05-17 LAB — TSH: TSH: 3.978 u[IU]/mL (ref 0.350–4.500)

## 2015-05-17 LAB — ECHOCARDIOGRAM COMPLETE

## 2015-05-17 LAB — CBG MONITORING, ED: GLUCOSE-CAPILLARY: 92 mg/dL (ref 65–99)

## 2015-05-17 LAB — TROPONIN I: Troponin I: <0.03 ng/mL (ref <0.031)

## 2015-05-17 MED ORDER — VITAMIN D-3 25 MCG (1000 UT) PO CAPS: ORAL_CAPSULE | Freq: Every day | ORAL | Status: DC

## 2015-05-17 MED ORDER — METOPROLOL TARTRATE 25 MG PO TABS: 25.0000 mg | ORAL_TABLET | Freq: Every day | ORAL | Status: DC | PRN

## 2015-05-17 MED ORDER — ADULT MULTIVITAMIN W/MINERALS CH
1.0000 | ORAL_TABLET | Freq: Every day | ORAL | Status: DC
  Administered 2015-05-17 – 2015-05-18 (×2): 1 via ORAL
  Filled 2015-05-17 (×2): qty 1

## 2015-05-17 MED ORDER — VITAMIN D 1000 UNITS PO TABS
1000.0000 [IU] | ORAL_TABLET | Freq: Every day | ORAL | Status: DC
  Administered 2015-05-17 – 2015-05-18 (×2): 1000 [IU] via ORAL
  Filled 2015-05-17 (×2): qty 1

## 2015-05-17 MED ORDER — OMEGA-3-ACID ETHYL ESTERS 1 G PO CAPS
2.0000 g | ORAL_CAPSULE | Freq: Two times a day (BID) | ORAL | Status: DC
  Administered 2015-05-17 – 2015-05-18 (×3): 2 g via ORAL
  Filled 2015-05-17 (×3): qty 2

## 2015-05-17 MED ORDER — LORAZEPAM 0.5 MG PO TABS: 0.5000 mg | ORAL_TABLET | Freq: Every evening | ORAL | Status: DC | PRN

## 2015-05-17 MED ORDER — ACETAMINOPHEN 325 MG PO TABS
650.0000 mg | ORAL_TABLET | ORAL | Status: DC | PRN
  Administered 2015-05-18: 650 mg via ORAL
  Filled 2015-05-17: qty 2

## 2015-05-17 MED ORDER — METOPROLOL TARTRATE 12.5 MG HALF TABLET
12.5000 mg | ORAL_TABLET | Freq: Two times a day (BID) | ORAL | Status: DC
  Administered 2015-05-17 – 2015-05-18 (×3): 12.5 mg via ORAL
  Filled 2015-05-17 (×3): qty 1

## 2015-05-17 MED ORDER — LORAZEPAM 2 MG/ML IJ SOLN
1.0000 mg | Freq: Every evening | INTRAMUSCULAR | Status: DC | PRN
  Administered 2015-05-17: 1 mg via INTRAVENOUS
  Filled 2015-05-17: qty 1

## 2015-05-17 MED ORDER — LORAZEPAM 2 MG/ML IJ SOLN
1.0000 mg | Freq: Once | INTRAMUSCULAR | Status: AC
  Administered 2015-05-17: 1 mg via INTRAVENOUS
  Filled 2015-05-17: qty 1

## 2015-05-17 MED ORDER — ONDANSETRON HCL 4 MG/2ML IJ SOLN: 4.0000 mg | Freq: Four times a day (QID) | INTRAMUSCULAR | Status: DC | PRN

## 2015-05-17 NOTE — ED Notes (Signed)
Pt wishes to speak with MD. She and husband want a script for ativan and to be d/c'd home, instead of waiting for an inpatient bed. Had Diplomatic Services operational officersecretary page admitting MD.

## 2015-05-17 NOTE — ED Notes (Signed)
Attempted report to 2W °

## 2015-05-17 NOTE — Progress Notes (Signed)
Patient Name: Pamela Short Date of Encounter: 05/17/2015   SUBJECTIVE  No chest pain or SOB. Intermittent palpitations. Pending to get bed on the floor.   CURRENT MEDS . cholecalciferol  1,000 Units Oral Daily  . multivitamin with minerals  1 tablet Oral Daily  . omega-3 acid ethyl esters  2 g Oral BID    OBJECTIVE  Filed Vitals:   05/17/15 1215 05/17/15 1230 05/17/15 1245 05/17/15 1300  BP: 99/71 105/74 112/84 112/80  Pulse: 73 64 71 74  Temp:      TempSrc:      Resp: 15 19 17 19   SpO2: 99% 99% 99% 99%   No intake or output data in the 24 hours ending 05/17/15 1312 There were no vitals filed for this visit.  PHYSICAL EXAM  General: Pleasant, NAD. Neuro: Alert and oriented X 3. Moves all extremities spontaneously. Psych: Normal affect. HEENT:  Normal  Neck: Supple without bruits or JVD. Lungs:  Resp regular and unlabored, CTA. Heart: RRR no s3, s4, or murmurs. Abdomen: Soft, non-tender, non-distended, BS + x 4.  Extremities: No clubbing, cyanosis or edema. DP/PT/Radials 2+ and equal bilaterally.  Accessory Clinical Findings  CBC  Recent Labs  05/16/15 1913  WBC 8.1  HGB 14.4  HCT 43.1  MCV 93.7  PLT 315   Basic Metabolic Panel  Recent Labs  05/16/15 1913  NA 141  K 4.3  CL 109  CO2 23  GLUCOSE 148*  BUN 6  CREATININE 0.74  CALCIUM 10.9*   Liver Function Tests No results for input(s): AST, ALT, ALKPHOS, BILITOT, PROT, ALBUMIN in the last 72 hours. No results for input(s): LIPASE, AMYLASE in the last 72 hours. Cardiac Enzymes  Recent Labs  05/17/15 0438  TROPONINI <0.03   BNP Invalid input(s): POCBNP D-Dimer  Recent Labs  05/16/15 2303  DDIMER 0.31   Hemoglobin A1C No results for input(s): HGBA1C in the last 72 hours. Fasting Lipid Panel No results for input(s): CHOL, HDL, LDLCALC, TRIG, CHOLHDL, LDLDIRECT in the last 72 hours. Thyroid Function Tests  Recent Labs  05/16/15 2303  TSH 3.978    TELE  Sinus rhythm with  rate mostly in 60-70s. Intermittently goes into 140s  Radiology/Studies  Dg Chest 2 View  05/16/2015  CLINICAL DATA:  Heart racing. EXAM: CHEST  2 VIEW COMPARISON:  05/01/2015 FINDINGS: The heart size and mediastinal contours are within normal limits. Both lungs are clear. The visualized skeletal structures are unremarkable. IMPRESSION: No active cardiopulmonary disease. Electronically Signed   By: Kennith CenterEric  Mansell M.D.   On: 05/16/2015 20:00   Dg Chest 2 View  05/01/2015  CLINICAL DATA:  Acute onset of left-sided chest pain and palpitations. Initial encounter. EXAM: CHEST  2 VIEW COMPARISON:  None. FINDINGS: The lungs are well-aerated and clear. There is no evidence of focal opacification, pleural effusion or pneumothorax. The heart is normal in size; the mediastinal contour is within normal limits. No acute osseous abnormalities are seen. IMPRESSION: No acute cardiopulmonary process seen. Electronically Signed   By: Roanna RaiderJeffery  Chang M.D.   On: 05/01/2015 04:10    ASSESSMENT AND PLAN  1. Sinus tachycardia (HCC) and  Intermittent palpitations - No arrhythmia noted on tele. Sinus rhythm with rate mostly in 60-70s. Intermittently goes into 140s. Troponin negative. D-dimer negative. Plasma cortisol and TSH are WNL. CXR clear.  - pending urine metanephrines. consider cortisol stimulation test as outpatient.  - Pending echo (spoken with echo tech to perform today). If normal --> discharge today.  -  Event monitor final reading result is pending by MD.   2.  Poor sleep pattern - Consider outpatient sleep study   Signed, Bhagat,Bhavinkumar PA-C Pager [phone]  The patient was seen, examined and discussed with Bhagat,Bhavinkumar PA-C and I agree with the above.   45 year old female with new onset - 2-3 weeks of palpitations with dizziness, SOB, presyncope, worsening in frequency, telemetry shows episodic sinus tachycardia up to 140 BPM, labs normal, TSH normal, echo normal, we will check 24 hour urine  metanephrine, catecholamines and 5-HIAA for the evaluation of pheochromocytoma and carcinoid syndrome.  Lars Masson 05/17/2015

## 2015-05-17 NOTE — ED Notes (Addendum)
Pt request CBG same done after discussed with MD.CBG 98

## 2015-05-17 NOTE — ED Notes (Signed)
Spoke with Dr. Tresa EndoKelly, who agreed to d/c to home but w/o ativan. I relayed information to pt, who decided to remain.

## 2015-05-17 NOTE — ED Notes (Signed)
Pt returned from ECHO.

## 2015-05-17 NOTE — ED Notes (Signed)
Pt taken to Echo.

## 2015-05-17 NOTE — ED Notes (Signed)
No response from admitting MD. Pamela MondaySpoke again with pt & husband, who reinforced desire to leave but not AMA without Rx for ativan.

## 2015-05-17 NOTE — Progress Notes (Signed)
  Echocardiogram 2D Echocardiogram has been performed.  Pamela Short, Pamela Short 05/17/2015, 2:35 PM

## 2015-05-18 ENCOUNTER — Ambulatory Visit (HOSPITAL_COMMUNITY)

## 2015-05-18 DIAGNOSIS — R000 Tachycardia, unspecified: Secondary | ICD-10-CM | POA: Diagnosis not present

## 2015-05-18 DIAGNOSIS — I10 Essential (primary) hypertension: Secondary | ICD-10-CM | POA: Diagnosis not present

## 2015-05-18 DIAGNOSIS — R002 Palpitations: Secondary | ICD-10-CM | POA: Diagnosis not present

## 2015-05-18 DIAGNOSIS — R0602 Shortness of breath: Secondary | ICD-10-CM | POA: Diagnosis not present

## 2015-05-18 LAB — PREGNANCY, URINE: Preg Test, Ur: NEGATIVE

## 2015-05-18 MED ORDER — METOPROLOL TARTRATE 25 MG PO TABS: 12.5000 mg | ORAL_TABLET | Freq: Two times a day (BID) | ORAL | Status: AC

## 2015-05-18 NOTE — Discharge Summary (Signed)
Discharge Summary    Patient ID: Blen Ransome,  MRN: [id number], DOB/AGE: Dec 23, 1970 45 y.o.  Admit date: 05/16/2015 Discharge date: 05/18/2015  Primary Care Provider: No PCP Per Patient Primary Cardiologist: Dr Rennis Golden  Discharge Diagnoses    Principal Problem:   Sinus tachycardia (HCC) Active Problems:   Intermittent palpitations   Poor sleep pattern   Essential hypertension   SOB (shortness of breath)   Allergies No Known Allergies  Diagnostic Studies/Procedures    Echo: 05/17/2015 - Left ventricle: The cavity size was normal. Systolic function was  vigorous. The estimated ejection fraction was in the range of 65%  to 70%. Wall motion was normal; there were no regional wall  motion abnormalities. Left ventricular diastolic function  parameters were normal. There was no evidence of elevated  ventricular filling pressure by Doppler parameters. - Aortic valve: Trileaflet; normal thickness leaflets. There was no  regurgitation. - Aortic root: The aortic root was normal in size. - Mitral valve: There was trivial regurgitation. - Left atrium: The atrium was normal in size. - Right ventricle: Systolic function was normal. - Right atrium: The atrium was normal in size. - Tricuspid valve: There was no regurgitation. - Pulmonary arteries: Systolic pressure was within the normal range. - Inferior vena cava: The vessel was normal in size. - Pericardium, extracardiac: There was no pericardial effusion. _____________   History of Present Illness     46 yo female w/ no previous cardiac issues, seen by Dr Rennis Golden 03/01 for palpitations, event monitor completed but not yet evaluated. Had additional symptoms and was admitted 05/16/2015.  Hospital Course     Consultants: none   Ms Pacini was kept on telemetry and was in SR, with episodes of sinus tachycardia up to the 140s. Her enzymes were negative for MI. Her echo was normal, results above. TSH was normal. 24 urine  testing for pheo and carcinoid has been done, results pending. She has had cortisol problems in the past, if the urine testing is negative, she may need a cortisol stimulation test.   According to the patient, her HR has been in the 160s at times, she is very symptomatic with this. Hopefully, the episodes will be blunted or prevented with a low dose of metoprolol. She can also take extra metoprolol for symptoms.   On 03/14, she was seen by Dr Delton See and all data were reviewed. No further inpatient workup was indicated and she is considered stable for discharge, to follow up as an outpatient.  _____________  Discharge Vitals Blood pressure 106/69, pulse 68, temperature 98.1 F (36.7 C), temperature source Oral, resp. rate 18, height  (1.6 m), weight 156 lb 14.4 oz (71.169 kg), last menstrual period 04/19/2015, SpO2 99 %.  Filed Weights   05/17/15 1555  Weight: 156 lb 14.4 oz (71.169 kg)    Labs & Radiologic Studies     CBC  Recent Labs  05/16/15 1913  WBC 8.1  HGB 14.4  HCT 43.1  MCV 93.7  PLT 315   Basic Metabolic Panel  Recent Labs  05/16/15 1913  NA 141  K 4.3  CL 109  CO2 23  GLUCOSE 148*  BUN 6  CREATININE 0.74  CALCIUM 10.9*   Cardiac Enzymes  Recent Labs  05/17/15 0438  TROPONINI <0.03   D-Dimer  Recent Labs  05/16/15 2303  DDIMER 0.31   Thyroid Function Tests  Recent Labs  05/16/15 2303  TSH 3.978    Dg Chest 2 View 05/16/2015  CLINICAL DATA:  Heart racing. EXAM: CHEST  2 VIEW COMPARISON:  05/01/2015 FINDINGS: The heart size and mediastinal contours are within normal limits. Both lungs are clear. The visualized skeletal structures are unremarkable. IMPRESSION: No active cardiopulmonary disease. Electronically Signed   By: Kennith CenterEric  Mansell M.D.   On: 05/16/2015 20:00    Disposition   Pt is being discharged home today in good condition.  Follow-up Plans & Appointments    Follow-up Information    Follow up with Chrystie NoseKenneth C Hilty, MD On  05/25/2015.   Specialty:  Cardiology   Why:  See provider at 9:30 am, please arrive 15 minutes early for paperwork.   Contact information:   [address] [place] [id number] [phone]      Discharge Instructions    Diet - low sodium heart healthy    Complete by:  As directed      Increase activity slowly    Complete by:  As directed            Discharge Medications   Current Discharge Medication List    CONTINUE these medications which have CHANGED   Details  metoprolol tartrate (LOPRESSOR) 25 MG tablet Take 0.5 tablets (12.5 mg total) by mouth 2 (two) times daily. Ok to take an extra 1/2 or 1 tab as needed for palpitations Qty: 60 tablet, Refills: 3      CONTINUE these medications which have NOT CHANGED   Details  Cholecalciferol (VITAMIN D-3 PO) Take 1 tablet by mouth daily.    ibuprofen (ADVIL,MOTRIN) 200 MG tablet Take 400 mg by mouth every 6 (six) hours as needed for mild pain.    Multiple Vitamin (MULTIVITAMIN WITH MINERALS) TABS tablet Take 1 tablet by mouth daily.    NON FORMULARY Take 1 capsule by mouth every evening. Bio-Hormone Replacement Therapy Vitamins Progesterone equivalent    Omega-3 Fatty Acids (FISH OIL) 1000 MG CAPS Take 2,000 mg by mouth daily.    ALPRAZolam (XANAX) 0.25 MG tablet Take 1 tablet (0.25 mg total) by mouth at bedtime as needed for anxiety. Qty: 5 tablet, Refills: 0   Associated Diagnoses: Anxiety        Outstanding Labs/Studies   24 hour urine for catecholamines, metanephrines and 5-HIAA  Duration of Discharge Encounter   Greater than 30 minutes including physician time.  Melida QuitterSigned, Nariya Neumeyer NP 05/18/2015, 4:39 PM

## 2015-05-18 NOTE — Progress Notes (Signed)
Patient Name: Pamela Short Date of Encounter: 05/18/2015   SUBJECTIVE  No chest pain or SOB. She finally slept well, only one episode of hot flush, dizziness and palpitations this am.   CURRENT MEDS . cholecalciferol  1,000 Units Oral Daily  . metoprolol tartrate  12.5 mg Oral BID  . multivitamin with minerals  1 tablet Oral Daily  . omega-3 acid ethyl esters  2 g Oral BID   OBJECTIVE  Filed Vitals:   05/17/15 1555 05/17/15 2147 05/18/15 1109 05/18/15 1142  BP: 110/76 92/58 101/62 105/69  Pulse: 69 63 67 62  Temp: 98.4 F (36.9 C) 98.2 F (36.8 C)    TempSrc: Oral Oral    Resp: Height:  (1.6 m)     Weight: 156 lb 14.4 oz (71.169 kg)     SpO2: 100% 100%  100%    Intake/Output Summary (Last 24 hours) at 05/18/15 1233 Last data filed at 05/18/15 0800  Gross per 24 hour  Intake    960 ml  Output    750 ml  Net    210 ml   Filed Weights   05/17/15 1555  Weight: 156 lb 14.4 oz (71.169 kg)   PHYSICAL EXAM  General: Pleasant, NAD. Neuro: Alert and oriented X 3. Moves all extremities spontaneously. Psych: Normal affect. HEENT:  Normal  Neck: Supple without bruits or JVD. Lungs:  Resp regular and unlabored, CTA. Heart: RRR no s3, s4, or murmurs. Abdomen: Soft, non-tender, non-distended, BS + x 4.  Extremities: No clubbing, cyanosis or edema. DP/PT/Radials 2+ and equal bilaterally.  Accessory Clinical Findings  CBC  Recent Labs  05/16/15 1913  WBC 8.1  HGB 14.4  HCT 43.1  MCV 93.7  PLT 315   Basic Metabolic Panel  Recent Labs  05/16/15 1913  NA 141  K 4.3  CL 109  CO2 23  GLUCOSE 148*  BUN 6  CREATININE 0.74  CALCIUM 10.9*    Recent Labs  05/17/15 0438  TROPONINI <0.03   BNP Invalid input(s): POCBNP D-Dimer  Recent Labs  05/16/15 2303  DDIMER 0.31    Recent Labs  05/16/15 2303  TSH 3.978    TELE  Sinus rhythm with rate mostly in 60-70s. Intermittently goes into 140s  Radiology/Studies  Dg Chest 2  View  05/16/2015  CLINICAL DATA:  Heart racing. EXAM: CHEST  2 VIEW COMPARISON:  05/01/2015 FINDINGS: The heart size and mediastinal contours are within normal limits. Both lungs are clear. The visualized skeletal structures are unremarkable. IMPRESSION: No active cardiopulmonary disease. Electronically Signed   By: Kennith Center M.D.   On: 05/16/2015 20:00   Dg Chest 2 View  05/01/2015  CLINICAL DATA:  Acute onset of left-sided chest pain and palpitations. Initial encounter. EXAM: CHEST  2 VIEW COMPARISON:  None. FINDINGS: The lungs are well-aerated and clear. There is no evidence of focal opacification, pleural effusion or pneumothorax. The heart is normal in size; the mediastinal contour is within normal limits. No acute osseous abnormalities are seen. IMPRESSION: No acute cardiopulmonary process seen. Electronically Signed   By: Roanna Raider M.D.   On: 05/01/2015 04:10    ASSESSMENT AND PLAN  1. Sinus tachycardia (HCC) and  Intermittent palpitations - No arrhythmia noted on tele. Sinus rhythm with rate mostly in 60-70s. Intermittently goes into 140s. Troponin negative. D-dimer negative. Plasma cortisol and TSH are WNL. CXR clear.  - pending urine metanephrines. consider cortisol stimulation test as outpatient.  -  Pending echo (spoken with echo tech to perform today). If normal --> discharge today.  - Event monitor final reading result is pending by MD.   2.  Poor sleep pattern - Consider outpatient sleep study   45 year old female with new onset - 2-3 weeks of palpitations with dizziness, SOB, presyncope, worsening in frequency, telemetry shows episodic sinus tachycardia up to 140 BPM, labs normal, TSH normal, echo normal, we will check 24 hour urine metanephrine, catecholamines and 5-HIAA for the evaluation of pheochromocytoma and carcinoid syndrome. Her symptoms have improved after starting 25 mg of po metoprolol BID, we will continue, plan on a discharge later today once urine collection  is completed, follow up in our clinic in 1-2 weeks.   Lars MassonELSON, Adamaris King H 05/18/2015

## 2015-05-19 ENCOUNTER — Telehealth: Payer: Self-pay | Admitting: General Practice

## 2015-05-19 LAB — METANEPHRINES, PLASMA
Metanephrine, Free: 22 pg/mL (ref 0–62)
NORMETANEPHRINE FREE: 76 pg/mL (ref 0–145)

## 2015-05-19 NOTE — Telephone Encounter (Signed)
Ok to establish 

## 2015-05-19 NOTE — Telephone Encounter (Signed)
Relation to QI:ONGEpt:self Call back [phone]   Reason for call:  Quenten RavenMartin,Charles M [phone] referred patient to establish with Dr. Beverely Lowabori patient was [facility] and just recently was discharged. Patient is aware you are [place]oc[place] to [place]. Please advise

## 2015-05-19 NOTE — Telephone Encounter (Signed)
Patient scheduled for 05/24/2015 at 8am.

## 2015-05-21 ENCOUNTER — Telehealth: Payer: Self-pay | Admitting: Behavioral Health

## 2015-05-21 ENCOUNTER — Encounter: Payer: Self-pay | Admitting: Behavioral Health

## 2015-05-21 LAB — 5 HIAA, QUANTITATIVE, URINE, 24 HOUR
5-HIAA, Ur: 2.2 mg/L
5-HIAA,Quant.,24 Hr Urine: 4.4 mg/24 hr (ref 0.0–14.9)
Total Volume: 2000

## 2015-05-21 NOTE — Addendum Note (Signed)
Addended by: Harold BarbanBYRD, Shaquoia Miers E on: 05/21/2015 11:08 AM   Modules accepted: Medications

## 2015-05-21 NOTE — Telephone Encounter (Signed)
Pre-Visit Call completed with patient and chart updated.   Pre-Visit Info documented in Specialty Comments under Snap[employer]ot.    

## 2015-05-21 NOTE — Telephone Encounter (Signed)
Unable to reach patient at time of Pre-Visit Call.  Left message for patient to return call when available.    

## 2015-05-24 ENCOUNTER — Ambulatory Visit: Admitting: Family Medicine

## 2015-05-25 ENCOUNTER — Ambulatory Visit (INDEPENDENT_AMBULATORY_CARE_PROVIDER_SITE_OTHER): Admitting: Internal Medicine

## 2015-05-25 ENCOUNTER — Encounter: Payer: Self-pay | Admitting: Internal Medicine

## 2015-05-25 VITALS — BP 108/80 | HR 66 | Ht 63.0 in | Wt 157.0 lb

## 2015-05-25 DIAGNOSIS — I10 Essential (primary) hypertension: Secondary | ICD-10-CM | POA: Diagnosis not present

## 2015-05-25 DIAGNOSIS — E2740 Unspecified adrenocortical insufficiency: Secondary | ICD-10-CM

## 2015-05-25 DIAGNOSIS — R000 Tachycardia, unspecified: Secondary | ICD-10-CM | POA: Diagnosis not present

## 2015-05-25 DIAGNOSIS — R002 Palpitations: Secondary | ICD-10-CM

## 2015-05-25 DIAGNOSIS — Z8639 Personal history of other endocrine, nutritional and metabolic disease: Secondary | ICD-10-CM

## 2015-05-25 NOTE — Progress Notes (Signed)
OFFICE NOTE  Chief Complaint:  Hospital follow-up  Primary Care Physician: Pamela Rhymes, MD  HPI:  Pamela Short is a pleasant 45 year old female who is in the process of moving to [place] from the [facility] area. Several days ago she had noticed an increase in awareness of her heart and palpitations. She had several glasses of wine after dinner and went to bed. She was awakened early in the morning with a feeling of her heart racing. She had some pressure in her heart and felt like she could not take a deep breath. She also became mildly diaphoretic. She thought this was a panic attack. She's had panic attacks in the distant past but she said was related to some sort of adrenal problem. She went on to progesterone and started taking vitamin D and her symptoms improved. She also took some Xanax which helped. She ultimately presented to the ER. She ruled out for MI and was noted to have paroxysmal tachycardia. I reviewed her ER EKGs which shows normal sinus rhythm without clear evidence of preexcitation or accessory pathway or short PR interval. There was 1 ER rhythm strip which demonstrated a heart rate of 142 and it appears to be either sinus or atrial tachycardia. Apparently these episodes cause her to feel like she had to have a bowel movement. She did actually have a bowel movement in the ER and noted her heart rate improved, suggesting a vaguely responsive rhythm and possible AVNRT. Since that time she was discharged, she was given a prescription for metoprolol. She says she has not started taking that medicine. It was for 25 mg twice daily although her blood pressure at rest is only 106/76. She still having some episodes although not as severe as she had the night that she is presented. She has tried to decrease wine intake. She is not a caffeine user. She is generally healthy and exercises regularly. She is under significant stress with her recent move. Coronary artery diseases in her  family although her parents are both obese.  Pamela Short returns today for follow-up. She was recently hospitalized for her palpitations. She underwent workup including an echocardiogram which showed no significant findings. The monitor which I placed her on only showed periods of sinus tachycardia but no clear paroxysmal arrhythmias, although she feels like her heart rate races at times unexpectedly. Since discharge she felt like her palpitations have been improved on Lopressor, but she recently cut back her dose to one quarter tablet or 6.25 mg twice a day. She had 2 episodes this weekend which awakened her in the middle the night at a proximally 2 AM with her heart racing. Again, we have not been able to demonstrate any paroxysmal arrhythmias. This may be inappropriate sinus tachycardia. It is not clearly related to anxiety, although she does have some anxiety with it. I'm also interested in her past history of what sounds like adrenal abnormalities. According to the patient when she was in [place] she had been diagnosed in the past with adrenal failure and had a cortisol level of (zero) 0, if this is possible. Apparently this recovered, but she is not currently on adrenal replacement. She does see an OB/GYN in [place] who is providing hormone replacement therapy and apparently did a recent cortisol level which was 30. She has never seen an endocrinologist. She never had adrenal imaging or any pituitary imaging as well. Certainly, one could surmise that intermittent adrenal failure could play a role in her symptoms, although  she has not struggled with low blood pressure.  PMHx:  Past Medical History  Diagnosis Date  . Anxiety     Past Surgical History  Procedure Laterality Date  . Tonsillectomy    . Stapedes surgery    . Appendectomy      FAMHx:  Family History  Problem Relation Age of Onset  . Heart disease      father side of family  . Transient ischemic attack Father   . Heart Problems       father's side  . Cancer Maternal Grandmother     SOCHx:   reports that she has quit smoking. She has never used smokeless tobacco. She reports that she drinks alcohol. She reports that she does not use illicit drugs.  ALLERGIES:  No Known Allergies  ROS: Pertinent items noted in HPI and remainder of comprehensive ROS otherwise negative.  HOME MEDS: Current Outpatient Prescriptions  Medication Sig Dispense Refill  . ALPRAZolam (XANAX) 0.25 MG tablet Take 1 tablet (0.25 mg total) by mouth at bedtime as needed for anxiety. 5 tablet 0  . Cholecalciferol (VITAMIN D-3 PO) Take 1 tablet by mouth daily.    Marland Kitchen. ibuprofen (ADVIL,MOTRIN) 200 MG tablet Take 400 mg by mouth every 6 (six) hours as needed for mild pain.    . metoprolol tartrate (LOPRESSOR) 25 MG tablet Take 0.5 tablets (12.5 mg total) by mouth 2 (two) times daily. Ok to take an extra 1/2 or 1 tab as needed for palpitations 60 tablet 3  . Multiple Vitamin (MULTIVITAMIN WITH MINERALS) TABS tablet Take 1 tablet by mouth daily.    . NON FORMULARY Take 1 capsule by mouth every evening. Bio-Hormone Replacement Therapy Vitamins Progesterone equivalent    . Omega-3 Fatty Acids (FISH OIL) 1000 MG CAPS Take 2,000 mg by mouth daily.    . progesterone (PROMETRIUM) 100 MG capsule Take 1 capsule by mouth at bedtime.    . Saw Palmetto, Serenoa repens, (SAW PALMETTO PO) Take 1 capsule by mouth daily.     No current facility-administered medications for this visit.    LABS/IMAGING: No results found for this or any previous visit (from the past 48 hour(s)). No results found.  WEIGHTS: Wt Readings from Last 3 Encounters:  05/25/15 157 lb (71.215 kg)  05/17/15 156 lb 14.4 oz (71.169 kg)  05/05/15 161 lb (73.029 kg)    VITALS: BP 108/80 mmHg  Pulse 66  Ht 5\' 3"  (1.6 m)  Wt 157 lb (71.215 kg)  BMI 27.82 kg/m2  LMP 04/19/2015 (Approximate)  EXAM: Deferred  EKG: Deferred  ASSESSMENT: 1. Intermittent palpitation - Appears to be  inappropriate sinus tachycardia 2. Anxiety 3. Fatigue 4. History of adrenal insufficiency  PLAN: 1.   Ms. Pamela Short was recently hospitalized for palpitations. She underwent workup including echocardiogram which was normal and monitoring which failed to show any tachycardia arrhythmias although she did have intermittent sinus tachycardia. She had testing including plasma cortisol level which was normal, TSH which was normal and testing for carcinoid which was negative. She does tell me that she had a history of an undetectable cortisol level in the past but never had workup or further adrenal testing by endocrinology. I like to refer her to Dr. Elvera LennoxGherghe for additional workup. For now she is advised to continue metoprolol and she could take an extra half dose as needed for breakthrough palpitations. Ultimately she may need evaluation by my partners in cardiac electrophysiology if she continues to have these symptoms.  Chrystie NoseKenneth C. Amor Hyle,  MD, [facility] Attending Cardiologist [facility]  Lisette Abu Manaia Samad 05/25/2015, 10:40 AM

## 2015-05-25 NOTE — Patient Instructions (Signed)
You have been referred to Dr. Foye Spurlinghristina Gherge  Your physician recommends that you schedule a follow-up appointment in: 2 months with Dr. Rennis GoldenHilty

## 2015-05-31 ENCOUNTER — Telehealth: Payer: Self-pay | Admitting: Internal Medicine

## 2015-05-31 NOTE — Telephone Encounter (Signed)
New message      Pt is calling to get the name of the endocrinologist Dr Rennis GoldenHilty referred her to.  They have not called her and she want to call them but she cannot remember their name.

## 2015-05-31 NOTE — Telephone Encounter (Signed)
Returned patient call and gave name and office number of Dr. Elvera LennoxGherghe, to whom Dr. Rennis GoldenHilty referred patient to last week. Pt voiced thanks.  Advised that referral should have been placed at visit, but if referral order still needed to let me know & will be happy to submit.

## 2015-06-03 ENCOUNTER — Ambulatory Visit (INDEPENDENT_AMBULATORY_CARE_PROVIDER_SITE_OTHER): Admitting: Family Medicine

## 2015-06-03 ENCOUNTER — Telehealth: Payer: Self-pay | Admitting: Internal Medicine

## 2015-06-03 ENCOUNTER — Encounter: Payer: Self-pay | Admitting: Family Medicine

## 2015-06-03 VITALS — BP 110/78 | HR 76 | Temp 98.2°F | Resp 16 | Ht 63.5 in | Wt 161.2 lb

## 2015-06-03 DIAGNOSIS — Z8639 Personal history of other endocrine, nutritional and metabolic disease: Secondary | ICD-10-CM | POA: Diagnosis not present

## 2015-06-03 DIAGNOSIS — I10 Essential (primary) hypertension: Secondary | ICD-10-CM

## 2015-06-03 DIAGNOSIS — R000 Tachycardia, unspecified: Secondary | ICD-10-CM

## 2015-06-03 DIAGNOSIS — G43909 Migraine, unspecified, not intractable, without status migrainosus: Secondary | ICD-10-CM | POA: Insufficient documentation

## 2015-06-03 DIAGNOSIS — G43009 Migraine without aura, not intractable, without status migrainosus: Secondary | ICD-10-CM

## 2015-06-03 DIAGNOSIS — F411 Generalized anxiety disorder: Secondary | ICD-10-CM

## 2015-06-03 MED ORDER — SUMATRIPTAN SUCCINATE 50 MG PO TABS: 50.0000 mg | ORAL_TABLET | ORAL | Status: AC | PRN

## 2015-06-03 MED ORDER — CITALOPRAM HYDROBROMIDE 10 MG PO TABS: 10.0000 mg | ORAL_TABLET | Freq: Every day | ORAL | Status: DC

## 2015-06-03 NOTE — Progress Notes (Signed)
Pre visit review using our clinic review tool, if applicable. No additional management support is needed unless otherwise documented below in the visit note. 

## 2015-06-03 NOTE — Telephone Encounter (Signed)
Pt recently eval'd in office for palpitations. Will route to Dr. Rennis GoldenHilty for review.

## 2015-06-03 NOTE — Telephone Encounter (Signed)
New message     Patient calling want to know when can she resume exercise.

## 2015-06-03 NOTE — Progress Notes (Signed)
   Subjective:    Patient ID: Pamela Short, female    DOB: 02/09/1971, 45 y.o.   MRN: [id number]  HPI New to establish.  [place]  Cards- Hilty  HTN- ongoing issue.  Well controlled.  On Metoprolol.  Tachycardia- pt was recently hospitalized and had complete work up.  Cards diagnosed her w/ Sinus Tach and encouraged her to take Metoprolol daily w/ extra 1/2 tab for breakthrough symptoms.  Pt thinks sxs started 3.5 yrs ago when she had a panic attack.  Caffeine worsens sxs so she has eliminated this.  Pt was given xanax but she didn't like how it made her feel.  Began charting sxs and they would peak around her cycle.  Pt had hormonal workup done and was told her cortisol level was abnormal.  Started on hormone replacement which improved anxiety.  Migraines- ongoing issue for pt.  Typically hormonally linked.  Not currently on any medication- has never been on medication.  Thyroid nodule- has upcoming appt w/ Dr Elvera LennoxGherghe  SOB- finds herself SOB w/ walking up stairs.  Has not exercised since prior to palpitations starting.  + increased fatigue.  Anxiety- increased since becoming concerned about medical issues.   Review of Systems For ROS see HPI     Objective:   Physical Exam  Constitutional: She is oriented to person, place, and time. She appears well-developed and well-nourished. No distress.  HENT:  Head: Normocephalic and atraumatic.  Eyes: Conjunctivae and EOM are normal. Pupils are equal, round, and reactive to light.  Neck: Normal range of motion. Neck supple. No thyromegaly present.  Cardiovascular: Normal rate, regular rhythm, normal heart sounds and intact distal pulses.   No murmur heard. Pulmonary/Chest: Effort normal and breath sounds normal. No respiratory distress.  Abdominal: Soft. She exhibits no distension. There is no tenderness.  Musculoskeletal: She exhibits no edema.  Lymphadenopathy:    She has no cervical adenopathy.  Neurological: She is alert and  oriented to person, place, and time.  Skin: Skin is warm and dry.  Psychiatric: She has a normal mood and affect. Her behavior is normal.  Vitals reviewed.         Assessment & Plan:

## 2015-06-03 NOTE — Patient Instructions (Signed)
Follow up in 4-6 weeks to recheck anxiety Start the Celexa daily for anxiety Use the Imitrex as needed for migraine.  At onset of migraine, take 2 Excedrin Migraine.  If no improvement within 20-30 minutes, take the Imitrex Call with any questions or concerns Welcome!  We're glad to have you! Hang in there!!!

## 2015-06-04 NOTE — Telephone Encounter (Signed)
Ok to start low to moderate intensity exercise.  DR. HRexene Edison

## 2015-06-04 NOTE — Telephone Encounter (Signed)
Advice communicated to patient, who verbalized understanding. Aware to call for any future concerns.

## 2015-06-06 DIAGNOSIS — F411 Generalized anxiety disorder: Secondary | ICD-10-CM | POA: Insufficient documentation

## 2015-06-06 NOTE — Assessment & Plan Note (Signed)
New.  Pt reports sxs are much worse as she worries about her physical health in the last few months.  Is undergoing work up for tachycardia and has endo appt upcoming.  Will start low dose SSRI as pt proceeds through work up and continue to monitor for improvement in sxs.  Pt expressed understanding and is in agreement w/ plan.

## 2015-06-06 NOTE — Assessment & Plan Note (Signed)
New to provider, ongoing for pt.  Mostly menstrual related.  Pt has never been on medication for this and typically waits for HA to improve w/ sleep.  Will start Imitrex PRN.  Reviewed supportive care and red flags that should prompt return.  Pt expressed understanding and is in agreement w/ plan.

## 2015-06-06 NOTE — Assessment & Plan Note (Signed)
New.  Pt has had multiple episodes- including 1 that prompted recent hospitalization.  Pt had normal cardiac workup and was started on metoprolol for rate control.  Has Endo w/u pending.  May be anxiety related but seems more intense than that.  Still, SSRI was started.  Will follow along and monitor for improvement.

## 2015-06-06 NOTE — Assessment & Plan Note (Signed)
New to provider, ongoing for pt.  Well controlled today.  Asymptomatic.  Reviewed recent lab work.  No medication changes.  Will follow.

## 2015-06-06 NOTE — Assessment & Plan Note (Signed)
New to provider.  Pt's tachycardic episodes may be adrenal related although her lab work was (-) for pheo.  Has appt upcoming w/ Endo- she was referred to Dr Elvera LennoxGherghe but scheduled w/ Dr Everardo AllEllison.  We were able to correct this today on the pt's behalf.  Will follow along and assist as able.

## 2015-06-07 ENCOUNTER — Ambulatory Visit: Admitting: Endocrinology

## 2015-06-07 ENCOUNTER — Telehealth: Payer: Self-pay | Admitting: Internal Medicine

## 2015-06-07 NOTE — Telephone Encounter (Signed)
Received a call from patient.She stated her heart was beating fast at 2:00 am this morning,133 beats/min.Stated she took metoprolol 25 mg 1/2 tablet.Took another 1/2 tablet at 10:00 am.Stated she is sitting at her desk and heart rate 82.Stated her normal heart rate 59 to 62.Stated she is concerned.Stated she is scheduled to see a endocrinologist next week.Stated she is sob,feels like a lump in her throat.Stated she wanted to ask Dr.Hilty his advice.Message sent to Dr.Hilty.

## 2015-06-07 NOTE — Telephone Encounter (Signed)
Returned call to patient.Dr.Hilty's recommendations given.Stated she was feeling better this afternoon.Advised to keep appointment with endocrinologist 06/15/15.Advised to call back if needed.

## 2015-06-07 NOTE — Telephone Encounter (Signed)
That was ok to take additional medicine. Would not routinely take more than a full additional tablet. Would not consider her heart "racing" unless HR sustained over 120 bpm. Please reassure her.  Thanks.  Dr. HRexene Edison

## 2015-06-15 ENCOUNTER — Ambulatory Visit (INDEPENDENT_AMBULATORY_CARE_PROVIDER_SITE_OTHER): Admitting: Internal Medicine

## 2015-06-15 ENCOUNTER — Encounter: Payer: Self-pay | Admitting: Internal Medicine

## 2015-06-15 VITALS — BP 122/78 | HR 64 | Temp 98.2°F | Resp 12 | Ht 63.0 in | Wt 159.0 lb

## 2015-06-15 DIAGNOSIS — E2740 Unspecified adrenocortical insufficiency: Secondary | ICD-10-CM | POA: Diagnosis not present

## 2015-06-15 DIAGNOSIS — I479 Paroxysmal tachycardia, unspecified: Secondary | ICD-10-CM | POA: Diagnosis not present

## 2015-06-15 DIAGNOSIS — R7989 Other specified abnormal findings of blood chemistry: Secondary | ICD-10-CM

## 2015-06-15 NOTE — Patient Instructions (Signed)
Please come back for labs at 8 am, fasting. Plan to be here for 1h.  Please buy a glucometer and check blood sugars during the high heart rate spells. Let me know if sugars are <50-60.   We will schedule a new appt if the results are abnormal.

## 2015-06-15 NOTE — Progress Notes (Signed)
Patient ID: Pamela Short, female   DOB: 28-Jul-1970, 45 y.o.   MRN: [id number]   HPI  Pamela Short is a 45 y.o.-year-old female, referred by her cardiologist, Dr Rennis Golden, for evaluation for low cortisol (suspicion for adrenal insufficiency). She is here with her husband who offers part of the hx.  Pt. has been found to have an abnormal salivary cortisol level  in the past - which prompted a diagnosis of "adrenal fatigue" in [place].  She describes a h/o anxiety attacks - in 12/2011 >> ED >> Xanax.  She realized that her anxiety attacks  - connected to her cycles. At that time, she also gained a lot weight (size 0 >> 10). She had bioidentical hormonal testing >> menopause >> started Progesterone, testosterone, and adrenal support. She started to feel better, anxiety better, started to lose weight.   She then moved to [place] >> started to see integrative med in [place] >> started on bio-identical hh and testosterone pellets. She did well until 04/2015 >> started to have AP, tachycardia/palpitations >> went to the ED >> HR 167 >> sent to Dr Rennis Golden >> further investig. negative >> started a beta blocker.   She continues to have high HR despite beta blockers. Her HR is elevated even 5h at a time. She also has bowel movements when this happens. She was working out >> now not as much. No heat intolerance. Feels cold. Spells happen almost daily. She has a fitbit that tracks her HR >> up to 110 at rest, including at night.  Latest cortisol level available for review was low, however, this was drawn at 11 PM during patient's latest hospitalization: Component     Latest Ref Rng 05/16/2015  (Collected at 11 PM!)  Cortisol, Plasma      2.8   Other pertinent investigations: Component     Latest Ref Rng 05/16/2015 05/17/2015  5-HIAA, Ur     Undefined mg/L  2.2  5-HIAA,Quant.,24 Hr Urine     0.0 - 14.9 mg/24 hr  4.4  Total Volume       2000  Normetanephrine, Pl     0 - 145 pg/mL 76   Metanephrine, Pl     0 -  62 pg/mL 22   TSH     0.350 - 4.500 uIU/mL 3.978   Glucose-Capillary     65 - 99 mg/dL  92   BMP and CBC normal in 05/2015, except a Glu of 145, And a slightly high calcium of 10.9 (8.9-10.3). Also reviewed CMP from 05/01/2015: Normal glucose, at 92, normal calcium, at 10.1.  No h/o steroid use in the past. No h/o Depo-provera, Megace, po ketoconazole, phenytoin, rifampin, chronic fluconazole use. No h/o autoimmune diseases in pt or family mbs. No excess use of NSAIDs. No h/o generalized infections or HIV. No IVDA. No h/o malignancy.  Pt also mentions: - + fatigue - + nausea - + vomiting - + abdominal pain - + mm aches - + HAs - + dizziness - no syncopal episodes - no dark skin discoloration  No h/o hyponatremia or hyperkalemia.   Chemistry      Component Value Date/Time   NA 141 05/16/2015 1913   K 4.3 05/16/2015 1913   CL 109 05/16/2015 1913   CO2 23 05/16/2015 1913   BUN 6 05/16/2015 1913   CREATININE 0.74 05/16/2015 1913      Component Value Date/Time   CALCIUM 10.9* 05/16/2015 1913     Her husband mentions that she also has  an appointment with probing with integrative medicine center the end of this month.  ROS: Constitutional:see HPI Eyes: + blurry vision occas. In am, no xerophthalmia ENT: no sore throat, no nodules palpated in throat, no dysphagia/odynophagia, no hoarseness Cardiovascular: no CP/+ SOB/+ palpitations/no leg swelling Respiratory: no cough/+ SOB Gastrointestinal: no N/V/D/C/+ AP Musculoskeletal: no muscle/joint aches Skin: no rashes Neurological: no tremors/numbness/tingling/+ dizziness Psychiatric: no depression/+ anxiety  Past Medical History  Diagnosis Date  . Anxiety   . History of chicken pox   . Allergy   . Tachycardia   . Hypertension   . Migraine   . Thyroid nodule    Past Surgical History  Procedure Laterality Date  . Tonsillectomy    . Stapedes surgery    . Appendectomy     Social History   Social History  .  Marital Status: Married    Spouse Name: N/A  . Number of Children: N/A  . Years of Education: N/A   Occupational History  . Not on file.   Social History Main Topics  . Smoking status: Former Smoker    Quit date: 06/03/1990  . Smokeless tobacco: Never Used  . Alcohol Use: Yes  . Drug Use: No  . Sexual Activity: Yes   Other Topics Concern  . Not on file   Social History Narrative   Current Outpatient Prescriptions on File Prior to Visit  Medication Sig Dispense Refill  . ALPRAZolam (XANAX) 0.25 MG tablet Take 1 tablet (0.25 mg total) by mouth at bedtime as needed for anxiety. 5 tablet 0  . Cholecalciferol (VITAMIN D-3 PO) Take 1 tablet by mouth daily.    Marland Kitchen ibuprofen (ADVIL,MOTRIN) 200 MG tablet Take 400 mg by mouth every 6 (six) hours as needed for mild pain.    . metoprolol tartrate (LOPRESSOR) 25 MG tablet Take 0.5 tablets (12.5 mg total) by mouth 2 (two) times daily. Ok to take an extra 1/2 or 1 tab as needed for palpitations 60 tablet 3  . Multiple Vitamin (MULTIVITAMIN WITH MINERALS) TABS tablet Take 1 tablet by mouth daily.    . NON FORMULARY Take 1 capsule by mouth every evening. Bio-Hormone Replacement Therapy Vitamins Progesterone equivalent    . Omega-3 Fatty Acids (FISH OIL) 1000 MG CAPS Take 2,000 mg by mouth daily.    . progesterone (PROMETRIUM) 100 MG capsule Take 1 capsule by mouth at bedtime.    . Saw Palmetto, Serenoa repens, (SAW PALMETTO PO) Take 1 capsule by mouth daily.    . SUMAtriptan (IMITREX) 50 MG tablet Take 1 tablet (50 mg total) by mouth every 2 (two) hours as needed for migraine. May repeat in 2 hours if headache persists or recurs. 10 tablet 0  . citalopram (CELEXA) 10 MG tablet Take 1 tablet (10 mg total) by mouth daily. (Patient not taking: Reported on 06/15/2015) 30 tablet 6   No current facility-administered medications on file prior to visit.   No Known Allergies Family History  Problem Relation Age of Onset  . Heart disease      father side  of family  . Transient ischemic attack Father   . Hypertension Father   . Hyperlipidemia Father   . Heart Problems      father's side  . Cancer Maternal Grandmother     melanoma, startd as skin cancer on the back  . Hypertension Mother   . Hyperlipidemia Mother   . Heart disease Maternal Grandfather   . Diabetes Paternal Grandmother    PE: BP 122/78 mmHg  Pulse 64  Temp(Src) 98.2 F (36.8 C) (Oral)  Resp 12  Ht 5\' 3"  (1.6 m)  Wt 159 lb (72.122 kg)  BMI 28.17 kg/m2  SpO2 99% Wt Readings from Last 3 Encounters:  06/15/15 159 lb (72.122 kg)  06/03/15 161 lb 4 oz (73.143 kg)  05/25/15 157 lb (71.215 kg)   Constitutional: overweight, in NAD Eyes: PERRLA, EOMI, no exophthalmos ENT: moist mucous membranes, no thyromegaly, no cervical lymphadenopathy Cardiovascular: RRR, No MRG Respiratory: CTA B Gastrointestinal: abdomen soft, NT, ND, BS+ Musculoskeletal: no deformities, strength intact in all 4 Skin: moist, warm, no rashes; no dark discoloration of skin Neurological: no tremor with outstretched hands, DTR normal in all 4  ASSESSMENT: 1. Low cortisol  2. Paroxystic Tachycardia  PLAN:  1. Low cortisol - Patient had a questionable diagnosis of "adrenal fatigue" while in [place] around 2013. She does not remember cortisol levels, but remembers that this was a salivary test and also that she was started on an adrenal supplement. She is not on the adrenal supplement now. The most recent cortisol level available for review was from her most recent hospitalization, and it was low, however, this was drawn at 11 PM, therefore a conclusion about adrenal insufficiency cannot be drawn. - we discussed that the diagnosis test for AI is a cosyntropin stimulation test. I explained what this consists of. We will plan to check this at 8 am >> will return for this. - if pt turns out to have AI, we will need to check several tests to try to establish the etiology.  2. Paroxystic tachycardia -  Under investigation by cardiology  - we discussed about possible endocrine causes for tachycardia: 1. Thyrotoxicosis - she had a normal TSH in the hospital. We'll recheck a TSH, free T4, free T3. 2. Iodine excess - no recent CT scan with contrast or other increased iodine intake. She is on a multivitamin with iodine. 3. Menopause - I would not expect the tachycardia to be as pronounced. She is on hormone replacement. 4. Pheochromocytoma/paraganglioma - she had normal plasma metanephrines and normetanephrines in house 5. Hypoglycemia - I advised her to buy a glucometer and check blood sugars during her tachycardia episodes. We'll also check a hemoglobin A1c at the next lab draw. She had a high blood sugar but I'm not sure if this was fasting  6. Adrenal insufficiency - would rarely cause tachycardia spells -however, will check cosyntropin stimulation test as mentioned above  7. Cushing syndrome - a vast amount of cortisone in her system may cause tachycardia, however, a very low suspicion for this, since she does not have any other Cushing signs 8. Vitamin deficiencies - will check levels of vitamin B1, B12, D  Patient Instructions  Please come back for labs at 8 am, fasting. Plan to be here for 1h.  Please buy a glucometer and check blood sugars during the high heart rate spells. Let me know if sugars are <50-60.   We will schedule a new appt if the results are abnormal.  We'll schedule another appointment if the above results are abnormal. Orders Placed This Encounter  Procedures  . Vitamin B12  . Vitamin B1  . VITAMIN D 25 Hydroxy (Vit-D Deficiency, Fractures)  . Hemoglobin A1C  . ACTH  . Cortisol  . Cortisol  . Cortisol  . T3, free  . T4, free  . TSH   - time spent with the patient: 1 hour, of which >50% was spent in obtaining information about  her symptoms, reviewing her previous labs, evaluations, and treatments, counseling her about her condition (please see the discussed  topics above), and developing a plan to further investigate it.  I will addend the results when they become available.

## 2015-06-16 ENCOUNTER — Encounter (HOSPITAL_COMMUNITY): Payer: Self-pay | Admitting: Emergency Medicine

## 2015-06-16 ENCOUNTER — Emergency Department (HOSPITAL_COMMUNITY)
Admission: EM | Admit: 2015-06-16 | Discharge: 2015-06-16 | Disposition: A | Discharge status: Home / Self Care | Attending: Emergency Medicine | Admitting: Emergency Medicine

## 2015-06-16 ENCOUNTER — Emergency Department (HOSPITAL_COMMUNITY)

## 2015-06-16 DIAGNOSIS — F419 Anxiety disorder, unspecified: Secondary | ICD-10-CM | POA: Insufficient documentation

## 2015-06-16 DIAGNOSIS — R110 Nausea: Secondary | ICD-10-CM | POA: Diagnosis not present

## 2015-06-16 DIAGNOSIS — R1010 Upper abdominal pain, unspecified: Secondary | ICD-10-CM | POA: Diagnosis not present

## 2015-06-16 DIAGNOSIS — R002 Palpitations: Secondary | ICD-10-CM | POA: Insufficient documentation

## 2015-06-16 DIAGNOSIS — Z8639 Personal history of other endocrine, nutritional and metabolic disease: Secondary | ICD-10-CM | POA: Diagnosis not present

## 2015-06-16 DIAGNOSIS — Z87891 Personal history of nicotine dependence: Secondary | ICD-10-CM | POA: Diagnosis not present

## 2015-06-16 DIAGNOSIS — I10 Essential (primary) hypertension: Secondary | ICD-10-CM | POA: Diagnosis not present

## 2015-06-16 DIAGNOSIS — Z79899 Other long term (current) drug therapy: Secondary | ICD-10-CM | POA: Insufficient documentation

## 2015-06-16 DIAGNOSIS — R000 Tachycardia, unspecified: Secondary | ICD-10-CM | POA: Insufficient documentation

## 2015-06-16 DIAGNOSIS — G43909 Migraine, unspecified, not intractable, without status migrainosus: Secondary | ICD-10-CM | POA: Diagnosis not present

## 2015-06-16 DIAGNOSIS — Z8619 Personal history of other infectious and parasitic diseases: Secondary | ICD-10-CM | POA: Diagnosis not present

## 2015-06-16 DIAGNOSIS — R079 Chest pain, unspecified: Secondary | ICD-10-CM | POA: Diagnosis present

## 2015-06-16 LAB — CBC
HCT: 42.8 % (ref 36.0–46.0)
Hemoglobin: 14.4 g/dL (ref 12.0–15.0)
MCH: 31.8 pg (ref 26.0–34.0)
MCHC: 33.6 g/dL (ref 30.0–36.0)
MCV: 94.5 fL (ref 78.0–100.0)
PLATELETS: 319 10*3/uL (ref 150–400)
RBC: 4.53 MIL/uL (ref 3.87–5.11)
RDW: 13 % (ref 11.5–15.5)
WBC: 8.3 10*3/uL (ref 4.0–10.5)

## 2015-06-16 LAB — BASIC METABOLIC PANEL
Anion gap: 10 (ref 5–15)
BUN: 13 mg/dL (ref 6–20)
CALCIUM: 11 mg/dL — AB (ref 8.9–10.3)
CO2: 24 mmol/L (ref 22–32)
CREATININE: 0.75 mg/dL (ref 0.44–1.00)
Chloride: 105 mmol/L (ref 101–111)
GFR calc Af Amer: >60 mL/min (ref >60)
GFR calc non Af Amer: >60 mL/min (ref >60)
GLUCOSE: 101 mg/dL — AB (ref 65–99)
Potassium: 4.1 mmol/L (ref 3.5–5.1)
Sodium: 139 mmol/L (ref 135–145)

## 2015-06-16 LAB — I-STAT TROPONIN, ED: TROPONIN I, POC: 0 ng/mL (ref 0.00–0.08)

## 2015-06-16 MED ORDER — LORAZEPAM 1 MG PO TABS: 1.0000 mg | ORAL_TABLET | Freq: Three times a day (TID) | ORAL | Status: DC | PRN

## 2015-06-16 MED ORDER — LORAZEPAM 1 MG PO TABS
1.0000 mg | ORAL_TABLET | Freq: Once | ORAL | Status: AC
  Administered 2015-06-16: 1 mg via ORAL
  Filled 2015-06-16: qty 1

## 2015-06-16 NOTE — Discharge Instructions (Signed)
You were seen today for palpitations. Her workup is again reassuring. Increase metoprolol back to 12.5 twice a day and follow-up closely with Dr. Rennis GoldenHilty. You also be given a short course of anxiety medication.  Palpitations A palpitation is the feeling that your heartbeat is irregular or is faster than normal. It may feel like your heart is fluttering or skipping a beat. Palpitations are usually not a serious problem. However, in some cases, you may need further medical evaluation. CAUSES  Palpitations can be caused by:  Smoking.  Caffeine or other stimulants, such as diet pills or energy drinks.  Alcohol.  Stress and anxiety.  Strenuous physical activity.  Fatigue.  Certain medicines.  Heart disease, especially if you have a history of irregular heart rhythms (arrhythmias), such as atrial fibrillation, atrial flutter, or supraventricular tachycardia.  An improperly working pacemaker or defibrillator. DIAGNOSIS  To find the cause of your palpitations, your health care provider will take your medical history and perform a physical exam. Your health care provider may also have you take a test called an ambulatory electrocardiogram (ECG). An ECG records your heartbeat patterns over a 24-hour period. You may also have other tests, such as:  Transthoracic echocardiogram (TTE). During echocardiography, sound waves are used to evaluate how blood flows through your heart.  Transesophageal echocardiogram (TEE).  Cardiac monitoring. This allows your health care provider to monitor your heart rate and rhythm in real time.  Holter monitor. This is a portable device that records your heartbeat and can help diagnose heart arrhythmias. It allows your health care provider to track your heart activity for several days, if needed.  Stress tests by exercise or by giving medicine that makes the heart beat faster. TREATMENT  Treatment of palpitations depends on the cause of your symptoms and can vary  greatly. Most cases of palpitations do not require any treatment other than time, relaxation, and monitoring your symptoms. Other causes, such as atrial fibrillation, atrial flutter, or supraventricular tachycardia, usually require further treatment. HOME CARE INSTRUCTIONS   Avoid:  Caffeinated coffee, tea, soft drinks, diet pills, and energy drinks.  Chocolate.  Alcohol.  Stop smoking if you smoke.  Reduce your stress and anxiety. Things that can help you relax include:  A method of controlling things in your body, such as your heartbeats, with your mind (biofeedback).  Yoga.  Meditation.  Physical activity such as swimming, jogging, or walking.  Get plenty of rest and sleep. SEEK MEDICAL CARE IF:   You continue to have a fast or irregular heartbeat beyond 24 hours.  Your palpitations occur more often. SEEK IMMEDIATE MEDICAL CARE IF:  You have chest pain or shortness of breath.  You have a severe headache.  You feel dizzy or you faint. MAKE SURE YOU:  Understand these instructions.  Will watch your condition.  Will get help right away if you are not doing well or get worse.   This information is not intended to replace advice given to you by your health care provider. Make sure you discuss any questions you have with your health care provider.   Document Released: 02/18/2000 Document Revised: 02/25/2013 Document Reviewed: 04/21/2011 Elsevier Interactive Patient Education [employer]2016 [employer]vier Inc[employer]

## 2015-06-16 NOTE — ED Provider Notes (Signed)
CSN: [id number]     Arrival date & time 06/16/15  0117 History   By signing my name below, I, Arlan Organ, attest that this documentation has been prepared under the direction and in the presence of Shon Baton, MD.  Electronically Signed: Arlan Organ, ED Scribe. 06/16/2015. 2:50 AM.   Chief Complaint  Patient presents with  . Chest Pain   The history is provided by the patient. No language interpreter was used.    HPI Comments: Pamela Short is a 45 y.o. female with a PMHx of anxiety and HTN who presents to the Emergency Department complaining of intermittent, ongoing, recurrent central chest pain and palpitation x 1 day. She also reports nausea, upper abdominal discomfort, and dry mouth. No aggravating or alleviating factors at this time. Pt attempted an extra half dose of her prescribed Metoprolol prior to arrival with some improvement. No recent fever, chills, vomiting, or leg swelling. Prior Echocardiogram, Holter monitor, and CXR performed in the past without any abnormal findings. She is not currently on any Estrogen. No known allergies to medications.  Patient had a chest pain rule out admission in March. She has had an echocardiogram and has worn a Holter monitor. Episodes appear to be sinus tachycardia. ? AVNRT.  Workup in March included a d-dimer which was reassuring.  Initially her metoprolol was written for 12.5 twice a day; however, her heart rates dropped into the mid 50s so she decreased this to 6.25 twice a day.  PCP: Neena Rhymes, MD   CARDIOLOGIST: Dr. Zoila Shutter   Past Medical History  Diagnosis Date  . Anxiety   . History of chicken pox   . Allergy   . Tachycardia   . Hypertension   . Migraine   . Thyroid nodule    Past Surgical History  Procedure Laterality Date  . Tonsillectomy    . Stapedes surgery    . Appendectomy     Family History  Problem Relation Age of Onset  . Heart disease      father side of family  . Transient ischemic attack  Father   . Hypertension Father   . Hyperlipidemia Father   . Heart Problems      father's side  . Cancer Maternal Grandmother     melanoma, startd as skin cancer on the back  . Hypertension Mother   . Hyperlipidemia Mother   . Heart disease Maternal Grandfather   . Diabetes Paternal Grandmother    Social History  Substance Use Topics  . Smoking status: Former Smoker    Quit date: 06/03/1990  . Smokeless tobacco: Never Used  . Alcohol Use: Yes   OB History    No data available     Review of Systems  Constitutional: Negative for fever and chills.  Respiratory: Negative for cough.   Cardiovascular: Positive for chest pain and palpitations. Negative for leg swelling.  Gastrointestinal: Negative for nausea and vomiting.  Neurological: Negative for headaches.  Psychiatric/Behavioral: Negative for confusion.  All other systems reviewed and are negative.     Allergies  Review of patient's allergies indicates no known allergies.  Home Medications   Prior to Admission medications   Medication Sig Start Date End Date Taking? Authorizing Provider  ALPRAZolam (XANAX) 0.25 MG tablet Take 1 tablet (0.25 mg total) by mouth at bedtime as needed for anxiety. 05/08/15  Yes Dwana Melena, PA-C  Cholecalciferol (VITAMIN D-3 PO) Take 1 tablet by mouth daily.   Yes Historical Provider, MD  ibuprofen (  ADVIL,MOTRIN) 200 MG tablet Take 400 mg by mouth every 6 (six) hours as needed for mild pain.   Yes Historical Provider, MD  metoprolol tartrate (LOPRESSOR) 25 MG tablet Take 0.5 tablets (12.5 mg total) by mouth 2 (two) times daily. Ok to take an extra 1/2 or 1 tab as needed for palpitations 05/18/15  Yes Rhonda G Barrett, PA-C  Multiple Vitamin (MULTIVITAMIN WITH MINERALS) TABS tablet Take 1 tablet by mouth daily.   Yes Historical Provider, MD  NON FORMULARY Take 1 capsule by mouth every evening. Bio-Hormone Replacement Therapy Vitamins Progesterone equivalent   Yes Historical Provider, MD   Omega-3 Fatty Acids (FISH OIL) 1000 MG CAPS Take 2,000 mg by mouth daily.   Yes Historical Provider, MD  progesterone (PROMETRIUM) 100 MG capsule Take 1 capsule by mouth at bedtime. 05/22/15  Yes Historical Provider, MD  Saw Palmetto, Serenoa repens, (SAW PALMETTO PO) Take 1 capsule by mouth daily.   Yes Historical Provider, MD  SUMAtriptan (IMITREX) 50 MG tablet Take 1 tablet (50 mg total) by mouth every 2 (two) hours as needed for migraine. May repeat in 2 hours if headache persists or recurs. 06/03/15  Yes Sheliah HatchKatherine E Tabori, MD  citalopram (CELEXA) 10 MG tablet Take 1 tablet (10 mg total) by mouth daily. 06/03/15   Sheliah HatchKatherine E Tabori, MD  LORazepam (ATIVAN) 1 MG tablet Take 1 tablet (1 mg total) by mouth every 8 (eight) hours as needed for anxiety. 06/16/15   Shon Batonourtney F Dayna Alia, MD   Triage Vitals: BP 110/85 mmHg  Pulse 62  Temp(Src) 98.9 F (37.2 C) (Oral)  Resp 16  Ht 5\' 3"  (1.6 m)  Wt 160 lb (72.576 kg)  BMI 28.35 kg/m2  SpO2 97%  LMP 06/15/2015   Physical Exam  Constitutional: She is oriented to person, place, and time. She appears well-developed and well-nourished.  Anxious appearing  HENT:  Head: Normocephalic and atraumatic.  Cardiovascular: Normal rate, regular rhythm and normal heart sounds.   No murmur heard. Pulmonary/Chest: Effort normal and breath sounds normal. No respiratory distress. She has no wheezes.  Abdominal: Soft. Bowel sounds are normal. There is no tenderness. There is no rebound.  Musculoskeletal: She exhibits no edema.  Neurological: She is alert and oriented to person, place, and time.  Skin: Skin is warm and dry.  Psychiatric: She has a normal mood and affect.  Nursing note and vitals reviewed.   ED Course  Procedures (including critical care time)  DIAGNOSTIC STUDIES: Oxygen Saturation is 98% on RA, Normal by my interpretation.    COORDINATION OF CARE: 2:46 AM- Will order CXR and blood work. Discussed treatment plan with pt at bedside and pt  agreed to plan.     Labs Review Labs Reviewed  BASIC METABOLIC PANEL - Abnormal; Notable for the following:    Glucose, Bld 101 (*)    Calcium 11.0 (*)    All other components within normal limits  CBC  I-STAT TROPOININ, ED    Imaging Review Dg Chest 2 View  06/16/2015  CLINICAL DATA:  Palpitations.  Tachycardia.  Onset tonight. EXAM: CHEST  2 VIEW COMPARISON:  None. FINDINGS: The heart size and mediastinal contours are within normal limits. Both lungs are clear. The visualized skeletal structures are unremarkable. IMPRESSION: No active cardiopulmonary disease. Electronically Signed   By: Ellery Plunkaniel R Mitchell M.D.   On: 06/16/2015 02:03   I have personally reviewed and evaluated these images and lab results as part of my medical decision-making.   EKG Interpretation  Date/Time:  Wednesday June 16 2015 01:25:11 EDT Ventricular Rate:  78 PR Interval:  160 QRS Duration: 78 QT Interval:  372 QTC Calculation: 424 R Axis:   72 Text Interpretation:  Normal sinus rhythm with sinus arrhythmia Normal ECG  Confirmed by Ivone Licht  MD, Tennis Mckinnon ([id number]) on 06/16/2015 2:24:50 AM      MDM   Final diagnoses:  Palpitations    Patient presents with palpitations and chest pain. Ongoing for the last 6 weeks but worsening tonight. Currently she states that she is anxious but her palpitations have resolved. EKG is normal sinus rhythm. Heart rate in the 60s to 70s. Workup as above. It appears these episodes are self-limited and appear to be sinus in nature.  Workup here is reassuring including troponin. Doubt PE and no change in risk factor since initial evaluation one month ago when dimer was negative. Discussed with on-call cardiology, Dr. Onalee Hua. Will increase metoprolol back to 12.5 twice a day. Monitor heart rate at home with that that. Patient was also given a short course of Ativan as she has anxiety related to these episodes. Follow-up with Dr. Rennis Golden for further evaluation and medication  adjustment.  After history, exam, and medical workup I feel the patient has been appropriately medically screened and is safe for discharge home. Pertinent diagnoses were discussed with the patient. Patient was given return precautions.  I personally performed the services described in this documentation, which was scribed in my presence. The recorded information has been reviewed and is accurate.   Shon Baton, MD 06/16/15 [phone]

## 2015-06-16 NOTE — ED Notes (Signed)
Pt. woke up this evening with intermittent central chest pain/upper abdominal  with mild SOB , dry mouth and nausea , denies pain at arrival .

## 2015-06-22 ENCOUNTER — Other Ambulatory Visit (INDEPENDENT_AMBULATORY_CARE_PROVIDER_SITE_OTHER)

## 2015-06-22 DIAGNOSIS — I479 Paroxysmal tachycardia, unspecified: Secondary | ICD-10-CM

## 2015-06-22 DIAGNOSIS — R7989 Other specified abnormal findings of blood chemistry: Secondary | ICD-10-CM

## 2015-06-22 DIAGNOSIS — Z8639 Personal history of other endocrine, nutritional and metabolic disease: Secondary | ICD-10-CM

## 2015-06-22 DIAGNOSIS — E2740 Unspecified adrenocortical insufficiency: Secondary | ICD-10-CM

## 2015-06-22 LAB — TSH: TSH: 2.52 u[IU]/mL (ref 0.35–4.50)

## 2015-06-22 LAB — HEMOGLOBIN A1C: HEMOGLOBIN A1C: 5 % (ref 4.6–6.5)

## 2015-06-22 LAB — CORTISOL
CORTISOL PLASMA: 8 ug/dL
CORTISOL PLASMA: 23.9 ug/dL
Cortisol, Plasma: 21 ug/dL

## 2015-06-22 LAB — T4, FREE: Free T4: 0.82 ng/dL (ref 0.60–1.60)

## 2015-06-22 LAB — VITAMIN B12: VITAMIN B 12: 498 pg/mL (ref 211–911)

## 2015-06-22 LAB — T3, FREE: T3 FREE: 3.2 pg/mL (ref 2.3–4.2)

## 2015-06-22 LAB — VITAMIN D 25 HYDROXY (VIT D DEFICIENCY, FRACTURES): VITD: 91.95 ng/mL (ref 30.00–100.00)

## 2015-06-22 MED ORDER — COSYNTROPIN 0.25 MG IJ SOLR
0.2500 mg | Freq: Once | INTRAMUSCULAR | Status: AC
  Administered 2015-06-22: 0.25 mg via INTRAMUSCULAR

## 2015-06-23 ENCOUNTER — Encounter: Payer: Self-pay | Admitting: *Deleted

## 2015-06-28 LAB — VITAMIN B1: VITAMIN B1 (THIAMINE): 18 nmol/L (ref 8–30)

## 2015-06-30 LAB — ACTH: C206 ACTH: 21 pg/mL (ref 6–50)

## 2015-07-03 ENCOUNTER — Emergency Department (HOSPITAL_COMMUNITY)

## 2015-07-03 ENCOUNTER — Emergency Department (HOSPITAL_COMMUNITY)
Admission: EM | Admit: 2015-07-03 | Discharge: 2015-07-04 | Disposition: A | Discharge status: Home / Self Care | Attending: Emergency Medicine | Admitting: Emergency Medicine

## 2015-07-03 ENCOUNTER — Encounter (HOSPITAL_COMMUNITY): Payer: Self-pay | Admitting: Emergency Medicine

## 2015-07-03 DIAGNOSIS — R110 Nausea: Secondary | ICD-10-CM | POA: Diagnosis not present

## 2015-07-03 DIAGNOSIS — E041 Nontoxic single thyroid nodule: Secondary | ICD-10-CM | POA: Insufficient documentation

## 2015-07-03 DIAGNOSIS — R079 Chest pain, unspecified: Secondary | ICD-10-CM | POA: Insufficient documentation

## 2015-07-03 DIAGNOSIS — R42 Dizziness and giddiness: Secondary | ICD-10-CM | POA: Insufficient documentation

## 2015-07-03 DIAGNOSIS — G43909 Migraine, unspecified, not intractable, without status migrainosus: Secondary | ICD-10-CM | POA: Insufficient documentation

## 2015-07-03 DIAGNOSIS — Z87891 Personal history of nicotine dependence: Secondary | ICD-10-CM | POA: Insufficient documentation

## 2015-07-03 DIAGNOSIS — I10 Essential (primary) hypertension: Secondary | ICD-10-CM | POA: Diagnosis not present

## 2015-07-03 DIAGNOSIS — F419 Anxiety disorder, unspecified: Secondary | ICD-10-CM | POA: Insufficient documentation

## 2015-07-03 DIAGNOSIS — R61 Generalized hyperhidrosis: Secondary | ICD-10-CM | POA: Diagnosis not present

## 2015-07-03 DIAGNOSIS — R002 Palpitations: Secondary | ICD-10-CM | POA: Diagnosis present

## 2015-07-03 DIAGNOSIS — Z8619 Personal history of other infectious and parasitic diseases: Secondary | ICD-10-CM | POA: Diagnosis not present

## 2015-07-03 DIAGNOSIS — Z79899 Other long term (current) drug therapy: Secondary | ICD-10-CM | POA: Diagnosis not present

## 2015-07-03 LAB — I-STAT TROPONIN, ED: TROPONIN I, POC: 0 ng/mL (ref 0.00–0.08)

## 2015-07-03 LAB — BASIC METABOLIC PANEL
Anion gap: 10 (ref 5–15)
BUN: 11 mg/dL (ref 6–20)
CALCIUM: 10.6 mg/dL — AB (ref 8.9–10.3)
CO2: 23 mmol/L (ref 22–32)
CREATININE: 0.76 mg/dL (ref 0.44–1.00)
Chloride: 107 mmol/L (ref 101–111)
GFR calc Af Amer: >60 mL/min (ref >60)
GFR calc non Af Amer: >60 mL/min (ref >60)
GLUCOSE: 94 mg/dL (ref 65–99)
Potassium: 4.1 mmol/L (ref 3.5–5.1)
Sodium: 140 mmol/L (ref 135–145)

## 2015-07-03 LAB — LIPASE, BLOOD: Lipase: 29 U/L (ref 11–51)

## 2015-07-03 LAB — CBC
HCT: 43.6 % (ref 36.0–46.0)
Hemoglobin: 14.7 g/dL (ref 12.0–15.0)
MCH: 32.5 pg (ref 26.0–34.0)
MCHC: 33.7 g/dL (ref 30.0–36.0)
MCV: 96.2 fL (ref 78.0–100.0)
PLATELETS: 362 10*3/uL (ref 150–400)
RBC: 4.53 MIL/uL (ref 3.87–5.11)
RDW: 12.7 % (ref 11.5–15.5)
WBC: 10.6 10*3/uL — ABNORMAL HIGH (ref 4.0–10.5)

## 2015-07-03 LAB — URINALYSIS, ROUTINE W REFLEX MICROSCOPIC
Bilirubin Urine: NEGATIVE
GLUCOSE, UA: NEGATIVE mg/dL
Hgb urine dipstick: NEGATIVE
KETONES UR: NEGATIVE mg/dL
Leukocytes, UA: NEGATIVE
NITRITE: NEGATIVE
PH: 6.5 (ref 5.0–8.0)
Protein, ur: NEGATIVE mg/dL
SPECIFIC GRAVITY, URINE: 1.013 (ref 1.005–1.030)

## 2015-07-03 MED ORDER — LORAZEPAM 1 MG PO TABS: 1.0000 mg | ORAL_TABLET | Freq: Two times a day (BID) | ORAL | Status: AC | PRN

## 2015-07-03 MED ORDER — LORAZEPAM 1 MG PO TABS
1.0000 mg | ORAL_TABLET | ORAL | Status: AC
  Administered 2015-07-03: 1 mg via ORAL
  Filled 2015-07-03: qty 1

## 2015-07-03 NOTE — ED Provider Notes (Signed)
CSN: [id number]     Arrival date & time 07/03/15  1911 History   First MD Initiated Contact with Patient 07/03/15 2146     Chief Complaint  Patient presents with  . Chest Pain  . Dizziness  . Nausea     (Consider location/radiation/quality/duration/timing/severity/associated sxs/prior Treatment) HPI   Patient is a 45 year old female with history of anxiety,, hypertension, palpitations, presents to emergency room with rapid heart rate today which lasted for 2 hours which associated with central chest pain, diaphoresis and bowel movement. She states that she has been having episodes for the past several months and has been worked up by endocrinology, cardiology and by her primary care provider. She is currently on low-dose metoprolol and states that this helps her symptoms sometimes she is also able take an extra dose when she felt palpitations come on which sometimes alleviates her rapid heart rate. She also endorses a sensation of anxiety which comes and goes, does have Ativan available. Per chart review her PCP was initiating an SSRI, however the pt did not want to take a daily medicine because she does not feel like she has anxiety daily. Dr. Rennis Golden has written worked her up for Holter monitoring, she has been admitted to the hospital for testing as well, Dr. Rennis Golden suggests she may need to see the electrophysiologist within the cardiology group.  She was reported to have adrenal insufficiency in the past and is currently being treated with naturopathic hormone replacement from both adrenals and for thyroid.  In the exam room she states that her rapid heart rate and associated symptoms have resolved since being in the ER however she still feels her heart rate in the 80s is higher than normal.  She states that episodes of rapid heart rate, intermittently sometimes waking her up in the middle of night other times when she is anxious and driving to work in the morning.  Per she is currently taking 6.25 mg  of metoprolol twice a day, she was rigidly and started on 25 mg total twice a day but has low blood pressure, so it was decreased to 12.5 BID.   She denies cough, wheeze, cold like symptoms, no recent fever, chills, vomiting, leg swelling. Prior Echocardiogram, Holter monitor, and CXR performed in the past without any abnormal findings. She is not currently on any Estrogen.   Past Medical History  Diagnosis Date  . Anxiety   . History of chicken pox   . Allergy   . Tachycardia   . Hypertension   . Migraine   . Thyroid nodule    Past Surgical History  Procedure Laterality Date  . Tonsillectomy    . Stapedes surgery    . Appendectomy     Family History  Problem Relation Age of Onset  . Heart disease      father side of family  . Transient ischemic attack Father   . Hypertension Father   . Hyperlipidemia Father   . Heart Problems      father's side  . Cancer Maternal Grandmother     melanoma, startd as skin cancer on the back  . Hypertension Mother   . Hyperlipidemia Mother   . Heart disease Maternal Grandfather   . Diabetes Paternal Grandmother    Social History  Substance Use Topics  . Smoking status: Former Smoker    Quit date: 06/03/1990  . Smokeless tobacco: Never Used  . Alcohol Use: Yes   OB History    No data available  Review of Systems  All other systems reviewed and are negative.     Allergies  Review of patient's allergies indicates no known allergies.  Home Medications   Prior to Admission medications   Medication Sig Start Date End Date Taking? Authorizing Provider  ALPRAZolam (XANAX) 0.25 MG tablet Take 1 tablet (0.25 mg total) by mouth at bedtime as needed for anxiety. 05/08/15  Yes Dwana Melena, PA-C  Cholecalciferol (VITAMIN D-3 PO) Take 1 tablet by mouth daily.   Yes Historical Provider, MD  ibuprofen (ADVIL,MOTRIN) 200 MG tablet Take 400 mg by mouth every 6 (six) hours as needed for mild pain.   Yes Historical Provider, MD  metoprolol  tartrate (LOPRESSOR) 25 MG tablet Take 0.5 tablets (12.5 mg total) by mouth 2 (two) times daily. Ok to take an extra 1/2 or 1 tab as needed for palpitations 05/18/15  Yes Rhonda G Barrett, PA-C  Multiple Vitamin (MULTIVITAMIN WITH MINERALS) TABS tablet Take 1 tablet by mouth daily.   Yes Historical Provider, MD  NON FORMULARY Take 1 capsule by mouth every evening. Bio-Hormone Replacement Therapy Vitamins Progesterone equivalent   Yes Historical Provider, MD  Omega-3 Fatty Acids (FISH OIL) 1000 MG CAPS Take 2,000 mg by mouth daily.   Yes Historical Provider, MD  progesterone (PROMETRIUM) 100 MG capsule Take 2 capsules by mouth at bedtime.  05/22/15  Yes Historical Provider, MD  Saw Palmetto, Serenoa repens, (SAW PALMETTO PO) Take 1 capsule by mouth daily.   Yes Historical Provider, MD  SUMAtriptan (IMITREX) 50 MG tablet Take 1 tablet (50 mg total) by mouth every 2 (two) hours as needed for migraine. May repeat in 2 hours if headache persists or recurs. 06/03/15  Yes [name], MD  Thyroid (NATURE-THROID) 48.75 MG TABS Take 1 tablet by mouth daily.   Yes Historical Provider, MD  citalopram (CELEXA) 10 MG tablet Take 1 tablet (10 mg total) by mouth daily. Patient not taking: Reported on 07/03/2015 06/03/15   [name], MD  LORazepam (ATIVAN) 1 MG tablet Take 1 tablet (1 mg total) by mouth 2 (two) times daily as needed for anxiety. 07/03/15   Danelle Berry, PA-C   BP 112/77 mmHg  Pulse 80  Temp(Src) 99.1 F (37.3 C) (Oral)  Resp 20  SpO2 97%  LMP 06/15/2015 Physical Exam  Constitutional: She is oriented to person, place, and time. She appears well-developed and well-nourished. No distress.  HENT:  Head: Normocephalic and atraumatic.  Nose: Nose normal.  Mouth/Throat: Oropharynx is clear and moist. No oropharyngeal exudate.  Eyes: Conjunctivae and EOM are normal. Pupils are equal, round, and reactive to light. Right eye exhibits no discharge. Left eye exhibits no discharge. No scleral  icterus.  Neck: Normal range of motion. No JVD present. No tracheal deviation present. No thyromegaly present.  Cardiovascular: Normal rate, regular rhythm, normal heart sounds and intact distal pulses.  Exam reveals no gallop and no friction rub.   No murmur heard. No lower extremity edema, symmetrical radial pulse 2+, dorsal pedis pulse 2+  Pulmonary/Chest: Effort normal and breath sounds normal. No respiratory distress. She has no wheezes. She has no rales. She exhibits no tenderness.  Lungs clear to auscultation without wheeze, rales or rhonchi  Abdominal: Soft. Bowel sounds are normal. She exhibits no distension and no mass. There is no tenderness. There is no rebound and no guarding.  Musculoskeletal: Normal range of motion. She exhibits no edema or tenderness.  Lymphadenopathy:    She has no cervical adenopathy.  Neurological: She is alert and oriented to person, place, and time. She has normal reflexes. No cranial nerve deficit. She exhibits normal muscle tone. Coordination normal.  Skin: Skin is warm and dry. No rash noted. She is not diaphoretic. No erythema. No pallor.  Brisk capillary refill in all extremities  Psychiatric: Her speech is normal and behavior is normal. Judgment and thought content normal. Her mood appears anxious (appears intermittently anxious and upset and tearful). Cognition and memory are normal.  Nursing note and vitals reviewed.   ED Course  Procedures (including critical care time) Labs Review Labs Reviewed  BASIC METABOLIC PANEL - Abnormal; Notable for the following:    Calcium 10.6 (*)    All other components within normal limits  CBC - Abnormal; Notable for the following:    WBC 10.6 (*)    All other components within normal limits  LIPASE, BLOOD  URINALYSIS, ROUTINE W REFLEX MICROSCOPIC (NOT AT [facility])  Rosezena Sensor, ED    Imaging Review Dg Chest 2 View  07/03/2015  CLINICAL DATA:  Sudden onset of chest pain, nausea and shortness of the  breath EXAM: CHEST  2 VIEW COMPARISON:  06/16/2015 FINDINGS: The heart size and mediastinal contours are within normal limits. Both lungs are clear. The visualized skeletal structures are unremarkable. IMPRESSION: No active cardiopulmonary disease. Electronically Signed   By: Signa Kell M.D.   On: 07/03/2015 20:03   I have personally reviewed and evaluated these images and lab results as part of my medical decision-making.   EKG Interpretation None      MDM     Patient presents with palpitations and chest pain. Which has been intermittent but persistent for the past 2 months, she has seen multiple providers and has been worked up by cardiology, endocrinology and her PCP. She endorses anxiety often associated closely with her palpitations, she is currently taking 6.25, total twice a day and additional doses as needed for palpitations. She was prescribed an SSRI to treat anxiety however she did not wish to take the medication as prescribed and does have Xanax and Ativan available to her.  She also had a Holter monitor which revealed sinus tachycardia. She was worked up by Dr. Rennis Golden, who notes no change in the suggested that she may possibly need electrophysiology workup, and monitors showed sinus tach and possible AV nodal reentry tachycardia .  Endocrinology is treating her for possible adrenal insufficiency and is also taking thyroid medications.   She states she has cut out all caffeine and is well-hydrated, without any other medical problems.  She currently appears anxious and has had increasing anxiety about her episodes. Some of which, after feeling upset, often preceeded by sweating of hands and feet.  Rapid heart rate episodes last up to 2 hours. They also occur in the middle the night waking her from sleep.  She has been admitted recently as well with negative cardiac echo, negative workup for pheo.    She is currently well-appearing but visibly anxious. EKG is normal sinus rhythm. Heart  rate in the 60s to 70s, cardiac monitoring while she has been in the ER was reviewed which was in sinus rhythm without any PVCs, throughout her stay in the ER. Workup here is reassuring including troponin.  Pt is PERC negative, doubt PE.  Feel pt may have psychogenic aspect to pain and tachycardia.  The various work ups from her multiple specialists were reviewed.  Still with her SBP 130-140's, pt should increase metoprolol back to 12.5  twice a day, as presribed and monitor heart rate and BP.  Pt will follow up with cardiology, as she may need to see EP. Patient given a refill of Ativan.   After history, exam, and medical workup I feel the patient has been appropriately medically screened and is safe for discharge home. Pertinent diagnoses were discussed with the patient. Patient was given return precautions.   Final diagnoses:  Palpitations      Danelle BerryLeisa Iyona Pehrson, PA-C 07/04/15 0120  Lyndal Pulleyaniel Knott, MD 07/04/15 [id number]  Lyndal Pulleyaniel Knott, MD 07/04/15 [id number]

## 2015-07-03 NOTE — ED Notes (Signed)
Patient arrives with complaint of sudden onset chest pain, nausea, and SOB while laying down today. Reports that symptoms began with her "feeling like her heart was racing". Endorses history of similar episodes, but states the heart racing sensation was not accompanied by the other symptoms. Currently denies pain, but still reports nausea. Patient explains that she wears a fitbit with HR which showed her rate to be 115 while she was laying in bed.

## 2015-07-03 NOTE — Discharge Instructions (Signed)

## 2015-07-11 ENCOUNTER — Emergency Department (HOSPITAL_COMMUNITY)
Admission: EM | Admit: 2015-07-11 | Discharge: 2015-07-11 | Disposition: A | Discharge status: Home / Self Care | Attending: Emergency Medicine | Admitting: Emergency Medicine

## 2015-07-11 ENCOUNTER — Encounter (HOSPITAL_COMMUNITY): Payer: Self-pay | Admitting: *Deleted

## 2015-07-11 DIAGNOSIS — E041 Nontoxic single thyroid nodule: Secondary | ICD-10-CM | POA: Diagnosis not present

## 2015-07-11 DIAGNOSIS — G43909 Migraine, unspecified, not intractable, without status migrainosus: Secondary | ICD-10-CM | POA: Insufficient documentation

## 2015-07-11 DIAGNOSIS — I10 Essential (primary) hypertension: Secondary | ICD-10-CM | POA: Insufficient documentation

## 2015-07-11 DIAGNOSIS — R079 Chest pain, unspecified: Secondary | ICD-10-CM | POA: Diagnosis present

## 2015-07-11 DIAGNOSIS — Z79899 Other long term (current) drug therapy: Secondary | ICD-10-CM | POA: Diagnosis not present

## 2015-07-11 DIAGNOSIS — Z8619 Personal history of other infectious and parasitic diseases: Secondary | ICD-10-CM | POA: Insufficient documentation

## 2015-07-11 DIAGNOSIS — R002 Palpitations: Secondary | ICD-10-CM | POA: Diagnosis not present

## 2015-07-11 DIAGNOSIS — F419 Anxiety disorder, unspecified: Secondary | ICD-10-CM | POA: Insufficient documentation

## 2015-07-11 LAB — BASIC METABOLIC PANEL
ANION GAP: 8 (ref 5–15)
BUN: 8 mg/dL (ref 6–20)
CALCIUM: 10.4 mg/dL — AB (ref 8.9–10.3)
CO2: 25 mmol/L (ref 22–32)
CREATININE: 0.71 mg/dL (ref 0.44–1.00)
Chloride: 107 mmol/L (ref 101–111)
Glucose, Bld: 93 mg/dL (ref 65–99)
Potassium: 4 mmol/L (ref 3.5–5.1)
SODIUM: 140 mmol/L (ref 135–145)

## 2015-07-11 LAB — I-STAT TROPONIN, ED: TROPONIN I, POC: 0 ng/mL (ref 0.00–0.08)

## 2015-07-11 LAB — CBC
HCT: 41.3 % (ref 36.0–46.0)
HEMOGLOBIN: 13.8 g/dL (ref 12.0–15.0)
MCH: 31.7 pg (ref 26.0–34.0)
MCHC: 33.4 g/dL (ref 30.0–36.0)
MCV: 94.9 fL (ref 78.0–100.0)
PLATELETS: 348 10*3/uL (ref 150–400)
RBC: 4.35 MIL/uL (ref 3.87–5.11)
RDW: 12.4 % (ref 11.5–15.5)
WBC: 10 10*3/uL (ref 4.0–10.5)

## 2015-07-11 MED ORDER — LORAZEPAM 2 MG/ML IJ SOLN
1.0000 mg | Freq: Once | INTRAMUSCULAR | Status: AC
  Administered 2015-07-11: 1 mg via INTRAVENOUS
  Filled 2015-07-11: qty 1

## 2015-07-11 MED ORDER — SODIUM CHLORIDE 0.9 % IV BOLUS (SEPSIS)
1000.0000 mL | Freq: Once | INTRAVENOUS | Status: AC
  Administered 2015-07-11: 1000 mL via INTRAVENOUS

## 2015-07-11 MED ORDER — METOPROLOL TARTRATE 5 MG/5ML IV SOLN
5.0000 mg | Freq: Once | INTRAVENOUS | Status: AC
  Administered 2015-07-11: 5 mg via INTRAVENOUS
  Filled 2015-07-11: qty 5

## 2015-07-11 NOTE — ED Notes (Signed)
The pt did not want to get the 5mg  of lopressor  She only wanted half  Gave half  Dr Patria Manecampos notified

## 2015-07-11 NOTE — ED Notes (Signed)
Dr. Campos at bedside   

## 2015-07-11 NOTE — ED Provider Notes (Signed)
CSN: [id number]     Arrival date & time 07/11/15  0102 History   By signing my name below, I, Pamela Short, attest that this documentation has been prepared under the direction and in the presence of Pamela BilisKevin Karalyn Kadel, MD.  Electronically Signed: Arlan OrganAshley Short, ED Scribe. 07/11/2015. 1:46 AM.   Chief Complaint  Patient presents with  . Chest Pain   The history is provided by the patient. No language interpreter was used.    HPI Comments: Pamela Short is a 45 y.o. female with a PMHx of HTN and thyroid nodule who presents to the Emergency Department complaining of constant, ongoing palpitations onset earlier this evening that woke her from sleep. Pt states she checked her Fitbit which showed her heart rate in the 130's-140's. She also reports ongoing lightheadedness, upper abdominal pain, nausea, and feeling "jittery". Pt reports an episode of watery diarrhea after onset of palpitations. Stools have also been more dense and dark in the last few days. No aggravating or alleviating factors at this time. Pt took prescribed Metoprolol prior to arrival without any improvement. No extraneous activity within the last 24 hours. She denies any fever, chills, diaphoresis, or shortness of breath. Ms. Pamela Short states symptoms have been intermittent and ongoing for several months now. Pt has had a Holter monitor in the past without any abnormal findings. Pt is being referred to an EP physician by her cardiologist. She admits to having 3 glasses of wine this evening.  PCP: Pamela RhymesKatherine Tabori, MD   CARDIOLOGIST: Pamela ShutterKenneth Hilty, MD  Past Medical History  Diagnosis Date  . Anxiety   . History of chicken pox   . Allergy   . Tachycardia   . Hypertension   . Migraine   . Thyroid nodule    Past Surgical History  Procedure Laterality Date  . Tonsillectomy    . Stapedes surgery    . Appendectomy     Family History  Problem Relation Age of Onset  . Heart disease      father side of family  . Transient ischemic  attack Father   . Hypertension Father   . Hyperlipidemia Father   . Heart Problems      father's side  . Cancer Maternal Grandmother     melanoma, startd as skin cancer on the back  . Hypertension Mother   . Hyperlipidemia Mother   . Heart disease Maternal Grandfather   . Diabetes Paternal Grandmother    Social History  Substance Use Topics  . Smoking status: Former Smoker    Quit date: 06/03/1990  . Smokeless tobacco: Never Used  . Alcohol Use: Yes   OB History    No data available     Review of Systems  Constitutional: Negative for fever and chills.  Respiratory: Negative for cough and shortness of breath.   Cardiovascular: Positive for palpitations.  Gastrointestinal: Positive for nausea and abdominal pain. Negative for vomiting.  Neurological: Positive for light-headedness. Negative for headaches.  Psychiatric/Behavioral: Negative for confusion.  All other systems reviewed and are negative.     Allergies  Review of patient's allergies indicates no known allergies.  Home Medications   Prior to Admission medications   Medication Sig Start Date End Date Taking? Authorizing Provider  ALPRAZolam (XANAX) 0.25 MG tablet Take 1 tablet (0.25 mg total) by mouth at bedtime as needed for anxiety. 05/08/15   Pamela MelenaBryan W Hager, PA-C  Cholecalciferol (VITAMIN D-3 PO) Take 1 tablet by mouth daily.    Historical Provider, MD  citalopram (  CELEXA) 10 MG tablet Take 1 tablet (10 mg total) by mouth daily. Patient not taking: Reported on 07/03/2015 06/03/15   [name], MD  ibuprofen (ADVIL,MOTRIN) 200 MG tablet Take 400 mg by mouth every 6 (six) hours as needed for mild pain.    Historical Provider, MD  LORazepam (ATIVAN) 1 MG tablet Take 1 tablet (1 mg total) by mouth 2 (two) times daily as needed for anxiety. 07/03/15   Danelle Berry, PA-C  metoprolol tartrate (LOPRESSOR) 25 MG tablet Take 0.5 tablets (12.5 mg total) by mouth 2 (two) times daily. Ok to take an extra 1/2 or 1 tab as  needed for palpitations 05/18/15   Pamela Salt Barrett, PA-C  Multiple Vitamin (MULTIVITAMIN WITH MINERALS) TABS tablet Take 1 tablet by mouth daily.    Historical Provider, MD  NON FORMULARY Take 1 capsule by mouth every evening. Bio-Hormone Replacement Therapy Vitamins Progesterone equivalent    Historical Provider, MD  Omega-3 Fatty Acids (FISH OIL) 1000 MG CAPS Take 2,000 mg by mouth daily.    Historical Provider, MD  progesterone (PROMETRIUM) 100 MG capsule Take 2 capsules by mouth at bedtime.  05/22/15   Historical Provider, MD  Saw Palmetto, Serenoa repens, (SAW PALMETTO PO) Take 1 capsule by mouth daily.    Historical Provider, MD  SUMAtriptan (IMITREX) 50 MG tablet Take 1 tablet (50 mg total) by mouth every 2 (two) hours as needed for migraine. May repeat in 2 hours if headache persists or recurs. 06/03/15   [name], MD  Thyroid (NATURE-THROID) 48.75 MG TABS Take 1 tablet by mouth daily.    Historical Provider, MD   Triage Vitals: BP 143/84 mmHg  Pulse 102  Temp(Src) 98.4 F (36.9 C) (Oral)  Ht 5\' 4"  (1.626 m)  Wt 153 lb (69.4 kg)  BMI 26.25 kg/m2  SpO2 99%  LMP 06/15/2015   Physical Exam  Constitutional: She is oriented to person, place, and time. She appears well-developed and well-nourished. No distress.  Anxious   HENT:  Head: Normocephalic and atraumatic.  Eyes: EOM are normal.  Neck: Normal range of motion.  Cardiovascular: Normal rate, regular rhythm and normal heart sounds.   Pulmonary/Chest: Effort normal and breath sounds normal.  Abdominal: Soft. She exhibits no distension. There is no tenderness.  Musculoskeletal: Normal range of motion.  Neurological: She is alert and oriented to person, place, and time.  Skin: Skin is warm and dry.  Psychiatric: She has a normal mood and affect. Judgment normal.  Nursing note and vitals reviewed.   ED Course  Procedures (including critical care time)  DIAGNOSTIC STUDIES: Oxygen Saturation is 99% on RA, Normal by my  interpretation.    COORDINATION OF CARE: 1:30 AM- Will order blood work and EKG. Will give Lopressor, Ativan, and fluids. Discussed treatment plan with pt at bedside and pt agreed to plan.     Labs Review Labs Reviewed  BASIC METABOLIC PANEL - Abnormal; Notable for the following:    Calcium 10.4 (*)    All other components within normal limits  CBC  I-STAT TROPOININ, ED    Imaging Review No results found. I have personally reviewed and evaluated these images and lab results as part of my medical decision-making.   EKG Interpretation   Date/Time:  Sunday Jul 11 2015 01:09:11 EDT Ventricular Rate:  104 PR Interval:  142 QRS Duration: 90 QT Interval:  328 QTC Calculation: 431 R Axis:   79 Text Interpretation:  Sinus tachycardia Otherwise normal ECG No  significant change was found Confirmed by Arbutus Nelligan  MD, Jaman Aro ([id number]) on  07/11/2015 1:29:20 AM      MDM   Final diagnoses:  Chest pain, unspecified chest pain type  Palpitations    Patient is overall well-appearing.  Patient has had an extensive outpatient workup for her chest pain palpitations.  This is not a presentation of acute coronary syndrome.  She is always in sinus tachycardia.  She is scheduled to see electrophysiology in this be beneficial.  Also recommended that she see a therapist to deal with emotional side of this.  Her permit care doctor had recommended an SSRI but she chose not to take this.  I think this could definitely be beneficial.  I had a long discussion with the patient and the patient's spouse about follow-up with primary care and the cardiologists.   I personally performed the services described in this documentation, which was scribed in my presence. The recorded information has been reviewed and is accurate.       Pamela Bilis, MD 07/11/15 [phone]

## 2015-07-11 NOTE — ED Notes (Signed)
Patient states she was having chest pain that started on the left side and radiated to the right chest.  +nausea and had a bout of diarrhea

## 2015-07-11 NOTE — ED Notes (Signed)
The pt is here with numerus symptoms with her heart beating fast mouth dry and other symptoms  For awhile.  Alert oriented skin warm and dry

## 2015-07-13 ENCOUNTER — Ambulatory Visit: Admitting: Family Medicine

## 2015-07-13 ENCOUNTER — Telehealth: Payer: Self-pay | Admitting: Family Medicine

## 2015-07-14 ENCOUNTER — Telehealth: Payer: Self-pay | Admitting: Internal Medicine

## 2015-07-14 NOTE — Telephone Encounter (Signed)
Called pt back. She says she continues to have ongoing palpitations.  She says today they started while she was in a meeting. She felt a "thud in her heart" then started sweating and had sudden diarrhea. She has gone to the ED multiple times in the past month for the palpitations and chest pain. She says she has been wearing a fitbit that keeps a record of the heartrate and it has been as high as 130's-140's. As we were talking on the phone, her heartrate was 95. She also complains of pain in her back, near her left shoulder blade when she is having these palpitations.  Scheduled pt a FLEX appt for 07/15/15. Pt aware of appt.  Pt verbalized understanding to go to the emergency room or call 911 if the chest pain worsens and he develops other symptoms, such as SOB, nausea, vomiting, or sweating.  Will route to Dr Rennis GoldenHilty and Julaine FusiJenna Elkins.

## 2015-07-14 NOTE — Telephone Encounter (Signed)
Pt has had heart racing problems, test's done were normal-still having break through problems with med-was told would get a referral to EP if test's were normal but didn't know which one-pls advise on plan

## 2015-07-15 ENCOUNTER — Ambulatory Visit: Admitting: Nurse Practitioner

## 2015-07-18 ENCOUNTER — Emergency Department (HOSPITAL_COMMUNITY)
Admission: EM | Admit: 2015-07-18 | Discharge: 2015-07-18 | Disposition: A | Discharge status: Home / Self Care | Attending: Emergency Medicine | Admitting: Emergency Medicine

## 2015-07-18 ENCOUNTER — Emergency Department (HOSPITAL_COMMUNITY)

## 2015-07-18 DIAGNOSIS — R0602 Shortness of breath: Secondary | ICD-10-CM | POA: Diagnosis not present

## 2015-07-18 DIAGNOSIS — R079 Chest pain, unspecified: Secondary | ICD-10-CM | POA: Diagnosis not present

## 2015-07-18 DIAGNOSIS — Z8619 Personal history of other infectious and parasitic diseases: Secondary | ICD-10-CM | POA: Diagnosis not present

## 2015-07-18 DIAGNOSIS — I10 Essential (primary) hypertension: Secondary | ICD-10-CM | POA: Insufficient documentation

## 2015-07-18 DIAGNOSIS — R002 Palpitations: Secondary | ICD-10-CM | POA: Diagnosis not present

## 2015-07-18 DIAGNOSIS — R110 Nausea: Secondary | ICD-10-CM | POA: Diagnosis not present

## 2015-07-18 DIAGNOSIS — Z87891 Personal history of nicotine dependence: Secondary | ICD-10-CM | POA: Insufficient documentation

## 2015-07-18 DIAGNOSIS — G43909 Migraine, unspecified, not intractable, without status migrainosus: Secondary | ICD-10-CM | POA: Insufficient documentation

## 2015-07-18 DIAGNOSIS — E041 Nontoxic single thyroid nodule: Secondary | ICD-10-CM | POA: Insufficient documentation

## 2015-07-18 DIAGNOSIS — Z793 Long term (current) use of hormonal contraceptives: Secondary | ICD-10-CM | POA: Insufficient documentation

## 2015-07-18 DIAGNOSIS — Z79899 Other long term (current) drug therapy: Secondary | ICD-10-CM | POA: Diagnosis not present

## 2015-07-18 DIAGNOSIS — R1011 Right upper quadrant pain: Secondary | ICD-10-CM | POA: Insufficient documentation

## 2015-07-18 DIAGNOSIS — F419 Anxiety disorder, unspecified: Secondary | ICD-10-CM | POA: Insufficient documentation

## 2015-07-18 DIAGNOSIS — R197 Diarrhea, unspecified: Secondary | ICD-10-CM | POA: Diagnosis not present

## 2015-07-18 LAB — BASIC METABOLIC PANEL
Anion gap: 8 (ref 5–15)
BUN: 17 mg/dL (ref 6–20)
CO2: 24 mmol/L (ref 22–32)
CREATININE: 0.77 mg/dL (ref 0.44–1.00)
Calcium: 10 mg/dL (ref 8.9–10.3)
Chloride: 106 mmol/L (ref 101–111)
Glucose, Bld: 101 mg/dL — ABNORMAL HIGH (ref 65–99)
Potassium: 4.2 mmol/L (ref 3.5–5.1)
SODIUM: 138 mmol/L (ref 135–145)

## 2015-07-18 LAB — I-STAT TROPONIN, ED: Troponin i, poc: 0 ng/mL (ref 0.00–0.08)

## 2015-07-18 LAB — CBC
HCT: 40.9 % (ref 36.0–46.0)
Hemoglobin: 13.8 g/dL (ref 12.0–15.0)
MCH: 32.4 pg (ref 26.0–34.0)
MCHC: 33.7 g/dL (ref 30.0–36.0)
MCV: 96 fL (ref 78.0–100.0)
PLATELETS: 324 10*3/uL (ref 150–400)
RBC: 4.26 MIL/uL (ref 3.87–5.11)
RDW: 12.6 % (ref 11.5–15.5)
WBC: 6.8 10*3/uL (ref 4.0–10.5)

## 2015-07-18 LAB — TROPONIN I

## 2015-07-18 NOTE — ED Provider Notes (Signed)
CSN: [id number]     Arrival date & time 07/18/15  0414 History   First MD Initiated Contact with Patient 07/18/15 [phone]     Chief Complaint  Patient presents with  . Chest Pain   (Consider location/radiation/quality/duration/timing/severity/associated sxs/prior Treatment) HPI  45 y.o. female with a hx of HTN, presents to the Emergency Department today complaining of chest pain this morning with palpitations. Noted SOB and nausea around 0230. Took metoprolol as prescribed and the pain was better. Noticed a 2nd occurrence 30 min later and took a second dose. Pain got better. Decided to come to ED for evaluation. Symptoms have been intermittent and ongoing for several months now. Notes checking her FitBit with heart rates in 150s. Notes watery diarrhea after palpitations as well. Being seen by Cardiology and has had Holter done. Notes no CP currently. No N/V. No SOB. No vision changes. Of note, pt did have RUQ abdominal tenderness and notes nausea after eating. No emesis. No other symptoms noted.   Cardiologist- Zoila Shutter, MD  Past Medical History  Diagnosis Date  . Anxiety   . History of chicken pox   . Allergy   . Tachycardia   . Hypertension   . Migraine   . Thyroid nodule    Past Surgical History  Procedure Laterality Date  . Tonsillectomy    . Stapedes surgery    . Appendectomy     Family History  Problem Relation Age of Onset  . Heart disease      father side of family  . Transient ischemic attack Father   . Hypertension Father   . Hyperlipidemia Father   . Heart Problems      father's side  . Cancer Maternal Grandmother     melanoma, startd as skin cancer on the back  . Hypertension Mother   . Hyperlipidemia Mother   . Heart disease Maternal Grandfather   . Diabetes Paternal Grandmother    Social History  Substance Use Topics  . Smoking status: Former Smoker    Quit date: 06/03/1990  . Smokeless tobacco: Never Used  . Alcohol Use: Yes   OB History    No  data available     Review of Systems ROS reviewed and all are negative for acute change except as noted in the HPI.  Allergies  Review of patient's allergies indicates no known allergies.  Home Medications   Prior to Admission medications   Medication Sig Start Date End Date Taking? Authorizing Provider  ALPRAZolam (XANAX) 0.25 MG tablet Take 1 tablet (0.25 mg total) by mouth at bedtime as needed for anxiety. 05/08/15  Yes Dwana Melena, PA-C  Cholecalciferol (VITAMIN D-3 PO) Take 1 tablet by mouth daily.   Yes Historical Provider, MD  ibuprofen (ADVIL,MOTRIN) 200 MG tablet Take 400 mg by mouth every 6 (six) hours as needed for mild pain.   Yes Historical Provider, MD  LORazepam (ATIVAN) 1 MG tablet Take 1 tablet (1 mg total) by mouth 2 (two) times daily as needed for anxiety. 07/03/15  Yes Danelle Berry, PA-C  metoprolol tartrate (LOPRESSOR) 25 MG tablet Take 0.5 tablets (12.5 mg total) by mouth 2 (two) times daily. Ok to take an extra 1/2 or 1 tab as needed for palpitations 05/18/15  Yes Rhonda G Barrett, PA-C  Multiple Vitamin (MULTIVITAMIN WITH MINERALS) TABS tablet Take 1 tablet by mouth daily.   Yes Historical Provider, MD  NON FORMULARY Take 1 capsule by mouth every evening. Bio-Hormone Replacement Therapy Vitamins Progesterone equivalent  Yes Historical Provider, MD  Omega-3 Fatty Acids (FISH OIL) 1000 MG CAPS Take 2,000 mg by mouth daily.   Yes Historical Provider, MD  progesterone (PROMETRIUM) 100 MG capsule Take 2 capsules by mouth at bedtime.  05/22/15  Yes Historical Provider, MD  Saw Palmetto, Serenoa repens, (SAW PALMETTO PO) Take 1 capsule by mouth daily.   Yes Historical Provider, MD  SUMAtriptan (IMITREX) 50 MG tablet Take 1 tablet (50 mg total) by mouth every 2 (two) hours as needed for migraine. May repeat in 2 hours if headache persists or recurs. 06/03/15  Yes [name], MD  Thyroid (NATURE-THROID) 48.75 MG TABS Take 1 tablet by mouth daily.   Yes Historical Provider,  MD  citalopram (CELEXA) 10 MG tablet Take 1 tablet (10 mg total) by mouth daily. Patient not taking: Reported on 07/03/2015 06/03/15   [name], MD   BP 161/70 mmHg  Pulse 73  Temp(Src) 98.9 F (37.2 C)  Resp 18  Ht 5' 3.5" (1.613 m)  Wt 68.04 kg  BMI 26.15 kg/m2  SpO2 99%   Physical Exam  Constitutional: She is oriented to person, place, and time. She appears well-developed and well-nourished. No distress.  HENT:  Head: Normocephalic and atraumatic.  Mouth/Throat: No trismus in the jaw.  Eyes: EOM are normal. Pupils are equal, round, and reactive to light.  Neck: Normal range of motion. Neck supple. No tracheal deviation present.  Cardiovascular: Normal rate, regular rhythm, S1 normal, S2 normal, normal heart sounds, intact distal pulses and normal pulses.   Pulmonary/Chest: Effort normal and breath sounds normal. No respiratory distress. She has no decreased breath sounds. She has no wheezes. She has no rhonchi. She has no rales.  Abdominal: Normal appearance and bowel sounds are normal. There is tenderness in the right upper quadrant. There is positive Murphy's sign. There is no rigidity, no rebound, no guarding, no CVA tenderness and no tenderness at McBurney's point.  Musculoskeletal: Normal range of motion.  Neurological: She is alert and oriented to person, place, and time.  Skin: Skin is warm and dry.  Psychiatric: She has a normal mood and affect. Her speech is normal and behavior is normal. Thought content normal.   ED Course  Procedures (including critical care time) Labs Review Labs Reviewed  BASIC METABOLIC PANEL - Abnormal; Notable for the following:    Glucose, Bld 101 (*)    All other components within normal limits  CBC  TROPONIN I  Rosezena Sensor, ED   Imaging Review Dg Chest 2 View  07/18/2015  CLINICAL DATA:  Chest pain and shortness of breath.  Cough, smoker. EXAM: CHEST  2 VIEW COMPARISON:  Chest radiograph July 03, 2015 FINDINGS:  Cardiomediastinal silhouette is normal. The lungs are clear without pleural effusions or focal consolidations. Trachea projects midline and there is no pneumothorax. Soft tissue planes and included osseous structures are non-suspicious. IMPRESSION: Normal chest. Electronically Signed   By: Awilda Metro M.D.   On: 07/18/2015 05:13   US Abdomen Limited Ruq  07/18/2015  CLINICAL DATA:  Right upper quadrant pain EXAM: US ABDOMEN LIMITED - RIGHT UPPER QUADRANT COMPARISON:  None. FINDINGS: Gallbladder: No gallstones or wall thickening visualized. No sonographic Murphy sign noted by sonographer. Common bile duct: Diameter: Normal, 1 mm Liver: No focal lesion identified. Within normal limits in parenchymal echogenicity. IMPRESSION: Normal liver, gallbladder and bile ducts Electronically Signed   By: Ellery Plunk M.D.   On: 07/18/2015 06:55   I have personally reviewed and  evaluated these images and lab results as part of my medical decision-making.   EKG Interpretation   Date/Time:  Sunday Jul 18 2015 04:25:21 EDT Ventricular Rate:  74 PR Interval:  156 QRS Duration: 84 QT Interval:  372 QTC Calculation: 412 R Axis:   88 Text Interpretation:  Normal sinus rhythm Normal ECG No significant change  since last tracing  except rate is slower Confirmed by WARD,  DO, KRISTEN  ([id number]) on 07/18/2015 4:46:10 AM      MDM  I have reviewed and evaluated the relevant laboratory values I have reviewed and evaluated the relevant imaging studies.  I have interpreted the relevant EKG. I have reviewed the relevant previous healthcare records. I obtained HPI from historian.  ED Course:  Assessment: Pt is a 44yF presents with CP this Am with SOB and palpitations. Notes similar episodes in the past. Has been seen in ED multiple times for same. Seen by cardiology. Neg Holter. Appointment with EP. Patient is to be discharged with recommendation to follow up with PCP in regards to today's hospital visit. Chest  pain is not likely of cardiac or pulmonary etiology d/t presentation, perc negative, VSS, no tracheal deviation, no JVD or new murmur, RRR, breath sounds equal bilaterally, EKG without acute abnormalities, negative troponin x2, and negative CXR. Pt had tenderness on RUQ exam. US of area showed no Cholecystitis. Pt has Metoprolol and Ativan. Told to return to the ED is CP becomes exertional, associated with diaphoresis or nausea, radiates to left jaw/arm, worsens or becomes concerning in any way. Pt appears reliable for follow up and is agreeable to discharge. Patient is in no acute distress. Vital Signs are stable. Patient is able to ambulate. Patient able to tolerate PO.   Disposition/Plan:  DC Home Additional Verbal discharge instructions given and discussed with patient.  Pt Instructed to f/u with Cardiology/Electrophysiologist at scheduled appointment for evaluation and treatment of symptoms. Return precautions given Pt acknowledges and agrees with plan  Supervising Physician Layla MawKristen N Ward, DO   Final diagnoses:  RUQ pain  Chest pain, unspecified chest pain type  Palpitations      Audry Piliyler Rylea Selway, PA-C 07/18/15 0929  Layla MawKristen N Ward, DO 07/19/15 0005

## 2015-07-18 NOTE — ED Notes (Signed)
The pt is c/o waking up with soB AND CHEST PAIN THIS AM   She has a historu of the same  She was just here ine week ago with the same  No sob now very very anxious  lmp last week

## 2015-07-18 NOTE — Discharge Instructions (Signed)
Please read and follow all provided instructions.  Your diagnoses today include:  1. Chest pain, unspecified chest pain type   2. RUQ pain   3. Palpitations    Tests performed today include:  An EKG of your heart  A chest x-ray  Cardiac enzymes - a blood test for heart muscle damage  Blood counts and electrolytes  Vital signs. See below for your results today.   Medications prescribed:   Take any prescribed medications only as directed.  Follow-up instructions: Please follow-up with your primary care provider as soon as you can for further evaluation of your symptoms.   Return instructions:  SEEK IMMEDIATE MEDICAL ATTENTION IF:  You have severe chest pain, especially if the pain is crushing or pressure-like and spreads to the arms, back, neck, or jaw, or if you have sweating, nausea (feeling sick to your stomach), or shortness of breath. THIS IS AN EMERGENCY. Don't wait to see if the pain will go away. Get medical help at once. Call 911 or 0 (operator). DO NOT drive yourself to the hospital.   Your chest pain gets worse and does not go away with rest.   You have an attack of chest pain lasting longer than usual, despite rest and treatment with the medications your caregiver has prescribed.   You wake from sleep with chest pain or shortness of breath.  You feel dizzy or faint.  You have chest pain not typical of your usual pain for which you originally saw your caregiver.   You have any other emergent concerns regarding your health.  Additional Information: Chest pain comes from many different causes. Your caregiver has diagnosed you as having chest pain that is not specific for one problem, but does not require admission.  You are at low risk for an acute heart condition or other serious illness.   Your vital signs today were: BP 161/70 mmHg   Pulse 73   Temp(Src) 98.9 F (37.2 C)   Resp 18   Ht 5' 3.5" (1.613 m)   Wt 68.04 kg   BMI 26.15 kg/m2   SpO2 99% If your  blood pressure (BP) was elevated above 135/85 this visit, please have this repeated by your doctor within one month. --------------

## 2015-07-20 NOTE — Telephone Encounter (Signed)
Pt was no show 07/13/15 9:30am for follow up appt, pt has not rescheduled, charge or no charge?

## 2015-07-20 NOTE — Telephone Encounter (Signed)
No charge as this is 1st one- will charge if it happens again

## 2015-08-03 ENCOUNTER — Encounter: Payer: Self-pay | Admitting: Internal Medicine

## 2015-08-03 ENCOUNTER — Ambulatory Visit (INDEPENDENT_AMBULATORY_CARE_PROVIDER_SITE_OTHER): Admitting: Internal Medicine

## 2015-08-03 VITALS — BP 98/62 | HR 70 | Ht 64.0 in | Wt 159.0 lb

## 2015-08-03 DIAGNOSIS — R000 Tachycardia, unspecified: Secondary | ICD-10-CM | POA: Diagnosis not present

## 2015-08-03 DIAGNOSIS — I10 Essential (primary) hypertension: Secondary | ICD-10-CM | POA: Diagnosis not present

## 2015-08-03 DIAGNOSIS — R0602 Shortness of breath: Secondary | ICD-10-CM | POA: Diagnosis not present

## 2015-08-03 NOTE — Progress Notes (Signed)
OFFICE NOTE  Chief Complaint:  Follow-up  Primary Care Physician: Neena Rhymes, MD  HPI:  Pamela Short is a pleasant 45 year old female who is in the process of moving to [place] from the [facility] area. Several days ago she had noticed an increase in awareness of her heart and palpitations. She had several glasses of wine after dinner and went to bed. She was awakened early in the morning with a feeling of her heart racing. She had some pressure in her heart and felt like she could not take a deep breath. She also became mildly diaphoretic. She thought this was a panic attack. She's had panic attacks in the distant past but she said was related to some sort of adrenal problem. She went on to progesterone and started taking vitamin D and her symptoms improved. She also took some Xanax which helped. She ultimately presented to the ER. She ruled out for MI and was noted to have paroxysmal tachycardia. I reviewed her ER EKGs which shows normal sinus rhythm without clear evidence of preexcitation or accessory pathway or short PR interval. There was 1 ER rhythm strip which demonstrated a heart rate of 142 and it appears to be either sinus or atrial tachycardia. Apparently these episodes cause her to feel like she had to have a bowel movement. She did actually have a bowel movement in the ER and noted her heart rate improved, suggesting a vaguely responsive rhythm and possible AVNRT. Since that time she was discharged, she was given a prescription for metoprolol. She says she has not started taking that medicine. It was for 25 mg twice daily although her blood pressure at rest is only 106/76. She still having some episodes although not as severe as she had the night that she is presented. She has tried to decrease wine intake. She is not a caffeine user. She is generally healthy and exercises regularly. She is under significant stress with her recent move. Coronary artery diseases in her family  although her parents are both obese.  Pamela Short returns today for follow-up. She was recently hospitalized for her palpitations. She underwent workup including an echocardiogram which showed no significant findings. The monitor which I placed her on only showed periods of sinus tachycardia but no clear paroxysmal arrhythmias, although she feels like her heart rate races at times unexpectedly. Since discharge she felt like her palpitations have been improved on Lopressor, but she recently cut back her dose to one quarter tablet or 6.25 mg twice a day. She had 2 episodes this weekend which awakened her in the middle the night at a proximally 2 AM with her heart racing. Again, we have not been able to demonstrate any paroxysmal arrhythmias. This may be inappropriate sinus tachycardia. It is not clearly related to anxiety, although she does have some anxiety with it. I'm also interested in her past history of what sounds like adrenal abnormalities. According to the patient when she was in [place] she had been diagnosed in the past with adrenal failure and had a cortisol level of (zero) 0, if this is possible. Apparently this recovered, but she is not currently on adrenal replacement. She does see an OB/GYN in [place] who is providing hormone replacement therapy and apparently did a recent cortisol level which was 30. She has never seen an endocrinologist. She never had adrenal imaging or any pituitary imaging as well. Certainly, one could surmise that intermittent adrenal failure could play a role in her symptoms, although she  has not struggled with low blood pressure.  08/03/2015  Pamela Short returns today for follow-up. She's been in the emergency department several times for inappropriate tachycardias. She says she notes that her heart will increase sometimes up to 150 beats a minute with minimal exertion. She knows she has to sit down and relax. These episodes do not start abruptly rather they come on with  some warning. I had referred her to an endocrinologist who did extensive testing for her reported low cortisol and found no evidence of adrenal insufficiency and normal thyroid function. 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 She has been seeing a integrated medicine practitioner, both in [place] and in [place] as well most recently who put her on thyroid medication based on the salivary test as well as she has had testosterone pellet injections. So far, monitoring his shown mostly what I would consider inappropriate sinus tachycardia. There is no evidence of an SVT. She has had some dizziness and shortness of breath. There has been no syncopal episodes. 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 It may also be related to her reported excess testosterone (secondary to pellet injections). She is noted to be mildly hypotensive today.  PMHx:  Past Medical History  Diagnosis Date  . Anxiety   . History of chicken pox   . Allergy   . Tachycardia   . Hypertension   . Migraine   . Thyroid nodule     Past Surgical History  Procedure Laterality Date  . Tonsillectomy    . Stapedes surgery    . Appendectomy      FAMHx:  Family History  Problem Relation Age of Onset  . Heart disease      father side of family  . Transient ischemic attack Father   . Hypertension Father   . Hyperlipidemia Father   . Heart Problems      father's side  . Cancer Maternal Grandmother     melanoma, startd as skin cancer on the back  . Hypertension Mother   . Hyperlipidemia Mother   . Heart disease Maternal Grandfather   . Diabetes Paternal Grandmother     SOCHx:   reports that she quit smoking about 25 years ago. She has never used smokeless tobacco. She reports that she drinks alcohol. She reports that she does not use illicit drugs.  ALLERGIES:  No Known Allergies  ROS: Pertinent items noted in HPI and remainder of comprehensive ROS otherwise  negative.  HOME MEDS: Current Outpatient Prescriptions  Medication Sig Dispense Refill  . Cholecalciferol (VITAMIN D-3 PO) Take 1 tablet by mouth daily.    Marland Kitchen. ibuprofen (ADVIL,MOTRIN) 200 MG tablet Take 400 mg by mouth every 6 (six) hours as needed for mild pain.    Marland Kitchen. LORazepam (ATIVAN) 1 MG tablet Take 1 tablet (1 mg total) by mouth 2 (two) times daily as needed for anxiety. 10 tablet 0  . magnesium 30 MG tablet Take 30 mg by mouth 2 (two) times daily.    . metoprolol tartrate (LOPRESSOR) 25 MG tablet Take 0.5 tablets (12.5 mg total) by mouth 2 (two) times daily. Ok to take an extra 1/2 or 1 tab as needed for palpitations 60 tablet 3  . Multiple Vitamin (MULTIVITAMIN WITH MINERALS) TABS tablet Take 1 tablet by mouth daily.    . NON FORMULARY Take 1 capsule by mouth every evening. Bio-Hormone Replacement Therapy Vitamins Progesterone equivalent    . Omega-3 Fatty Acids (FISH OIL) 1000 MG  CAPS Take 2,000 mg by mouth daily.    . progesterone (PROMETRIUM) 100 MG capsule Take 200 mg by mouth at bedtime.     . SUMAtriptan (IMITREX) 50 MG tablet Take 1 tablet (50 mg total) by mouth every 2 (two) hours as needed for migraine. May repeat in 2 hours if headache persists or recurs. 10 tablet 0  . Thyroid (NATURE-THROID) 48.75 MG TABS Take 1 tablet by mouth daily.    . citalopram (CELEXA) 10 MG tablet Take 1 tablet (10 mg total) by mouth daily. (Patient not taking: Reported on 07/03/2015) 30 tablet 6  . Saw Palmetto, Serenoa repens, (SAW PALMETTO PO) Take 1 capsule by mouth daily. Reported on 08/03/2015     No current facility-administered medications for this visit.    LABS/IMAGING: No results found for this or any previous visit (from the past 48 hour(s)). No results found.  WEIGHTS: Wt Readings from Last 3 Encounters:  08/03/15 159 lb (72.122 kg)  07/18/15 150 lb (68.04 kg)  07/11/15 153 lb (69.4 kg)    VITALS: BP 98/62 mmHg  Pulse 70  Ht 5\' 4"  (1.626 m)  Wt 159 lb (72.122 kg)  BMI 27.28  kg/m2  SpO2 98%  EXAM: Deferred  EKG: Deferred  ASSESSMENT: 1. Intermittent palpitations - Appears to be inappropriate sinus tachycardia 2. Anxiety 3. Fatigue 4. History of adrenal insufficiency  PLAN: 1.   Pamela Short continues to have episodes of what seems to be inappropriate sinus tachycardia. This is occasionally associated with some chest wall discomfort which is sore when resting on her chest. She's had fatigue and some low blood pressure without measurable orthostasis. This may be a version of POTS syndrome. I do not see a clear SVT however this could be further evaluated by cardiac electrophysiology. She may be better served by referral to an integrative dysautonomia clinic and I will refer her to [facility] for further evaluation.  Follow-up with me as needed.  Chrystie Nose, MD, [facility] Attending Cardiologist [facility]  Chrystie Nose 08/03/2015, 8:59 AM

## 2015-08-03 NOTE — Patient Instructions (Addendum)
You have been [facility] to [facility] for evaluation [facility] tachycardia - Dr. Erenest Rasheramille Frazier-Mills  Your physician recommends that you schedule a follow-up appointment as needed with Dr. Rennis GoldenHilty.

## 2015-08-05 DIAGNOSIS — A6920 Lyme disease, unspecified: Secondary | ICD-10-CM

## 2015-08-05 HISTORY — DX: Lyme disease, unspecified: A69.20

## 2015-08-06 ENCOUNTER — Encounter (HOSPITAL_COMMUNITY): Payer: Self-pay | Admitting: Emergency Medicine

## 2015-08-06 ENCOUNTER — Emergency Department (HOSPITAL_COMMUNITY)

## 2015-08-06 ENCOUNTER — Emergency Department (HOSPITAL_COMMUNITY)
Admission: EM | Admit: 2015-08-06 | Discharge: 2015-08-07 | Disposition: A | Discharge status: Home / Self Care | Attending: Emergency Medicine | Admitting: Emergency Medicine

## 2015-08-06 DIAGNOSIS — I10 Essential (primary) hypertension: Secondary | ICD-10-CM | POA: Diagnosis not present

## 2015-08-06 DIAGNOSIS — Z8619 Personal history of other infectious and parasitic diseases: Secondary | ICD-10-CM | POA: Diagnosis not present

## 2015-08-06 DIAGNOSIS — G43909 Migraine, unspecified, not intractable, without status migrainosus: Secondary | ICD-10-CM | POA: Diagnosis not present

## 2015-08-06 DIAGNOSIS — R0602 Shortness of breath: Secondary | ICD-10-CM | POA: Diagnosis not present

## 2015-08-06 DIAGNOSIS — R000 Tachycardia, unspecified: Secondary | ICD-10-CM | POA: Diagnosis not present

## 2015-08-06 DIAGNOSIS — Z87891 Personal history of nicotine dependence: Secondary | ICD-10-CM | POA: Diagnosis not present

## 2015-08-06 DIAGNOSIS — R002 Palpitations: Secondary | ICD-10-CM | POA: Insufficient documentation

## 2015-08-06 DIAGNOSIS — F419 Anxiety disorder, unspecified: Secondary | ICD-10-CM | POA: Insufficient documentation

## 2015-08-06 DIAGNOSIS — R079 Chest pain, unspecified: Secondary | ICD-10-CM | POA: Diagnosis present

## 2015-08-06 DIAGNOSIS — Z79899 Other long term (current) drug therapy: Secondary | ICD-10-CM | POA: Diagnosis not present

## 2015-08-06 DIAGNOSIS — E041 Nontoxic single thyroid nodule: Secondary | ICD-10-CM | POA: Insufficient documentation

## 2015-08-06 LAB — CBC
HEMATOCRIT: 42.1 % (ref 36.0–46.0)
HEMOGLOBIN: 14 g/dL (ref 12.0–15.0)
MCH: 31.3 pg (ref 26.0–34.0)
MCHC: 33.3 g/dL (ref 30.0–36.0)
MCV: 94.2 fL (ref 78.0–100.0)
Platelets: 332 10*3/uL (ref 150–400)
RBC: 4.47 MIL/uL (ref 3.87–5.11)
RDW: 12.4 % (ref 11.5–15.5)
WBC: 9.1 10*3/uL (ref 4.0–10.5)

## 2015-08-06 LAB — BASIC METABOLIC PANEL
ANION GAP: 5 (ref 5–15)
BUN: 13 mg/dL (ref 6–20)
CHLORIDE: 108 mmol/L (ref 101–111)
CO2: 26 mmol/L (ref 22–32)
Calcium: 10.5 mg/dL — ABNORMAL HIGH (ref 8.9–10.3)
Creatinine, Ser: 0.75 mg/dL (ref 0.44–1.00)
GFR calc Af Amer: >60 mL/min (ref >60)
GFR calc non Af Amer: >60 mL/min (ref >60)
GLUCOSE: 102 mg/dL — AB (ref 65–99)
POTASSIUM: 4.4 mmol/L (ref 3.5–5.1)
SODIUM: 139 mmol/L (ref 135–145)

## 2015-08-06 LAB — I-STAT TROPONIN, ED: Troponin i, poc: 0.02 ng/mL (ref 0.00–0.08)

## 2015-08-06 MED ORDER — ASPIRIN EC 325 MG PO TBEC
325.0000 mg | DELAYED_RELEASE_TABLET | Freq: Every day | ORAL | Status: DC
  Administered 2015-08-06: 325 mg via ORAL
  Filled 2015-08-06: qty 1

## 2015-08-06 MED ORDER — NITROGLYCERIN 0.4 MG SL SUBL
0.4000 mg | SUBLINGUAL_TABLET | SUBLINGUAL | Status: DC | PRN
  Filled 2015-08-06: qty 1

## 2015-08-06 NOTE — ED Notes (Signed)
MD at bedside. 

## 2015-08-06 NOTE — ED Notes (Addendum)
Pt states, "I don't feel right on the right side of my body."  Reports tingling to right side of face.  No neuro deficits on triage exam.   No facial droop.   Speech clear.  No arm drift.

## 2015-08-06 NOTE — ED Notes (Signed)
C/o discomfort in center of chest with sob and nausea since 6pm.  States she has been seen a couple of times in the last few weeks for "heart issues" and she is scheduled to be seen in [place] for same later this month.

## 2015-08-06 NOTE — ED Notes (Signed)
Pt up to restroom.  Gait steady and even.  Denies any increased dizziness/weakness with ambulation

## 2015-08-07 ENCOUNTER — Encounter (HOSPITAL_COMMUNITY): Payer: Self-pay | Admitting: *Deleted

## 2015-08-07 ENCOUNTER — Emergency Department (HOSPITAL_COMMUNITY)

## 2015-08-07 LAB — D-DIMER, QUANTITATIVE: D-Dimer, Quant: 0.91 ug/mL-FEU — ABNORMAL HIGH (ref 0.00–0.50)

## 2015-08-07 MED ORDER — LORAZEPAM 1 MG PO TABS
1.0000 mg | ORAL_TABLET | Freq: Once | ORAL | Status: AC
  Administered 2015-08-07: 1 mg via ORAL
  Filled 2015-08-07: qty 1

## 2015-08-07 MED ORDER — IOPAMIDOL (ISOVUE-370) INJECTION 76%
INTRAVENOUS | Status: AC
  Administered 2015-08-07: 100 mL
  Filled 2015-08-07: qty 100

## 2015-08-07 NOTE — ED Notes (Signed)
EDP at bedside updating pt 

## 2015-08-07 NOTE — ED Notes (Addendum)
Pt back from CT.   States her heart rate shot up while she was in CT.  Pt shaking intensely, very nervous.  Pt placed back on monitor, HR at 110.  MD made aware and provided with EKG.  Pt sts she checked her pulse monitor and HR got up to 145 during transport.    Pt requesting Ativan and pain medication for HA.  Will talk to MD.  Pt provided with ice water at this time.

## 2015-08-07 NOTE — ED Provider Notes (Signed)
CSN: [id number]     Arrival date & time 08/06/15  2124 History   First MD Initiated Contact with Patient 08/06/15 2220     Chief Complaint  Patient presents with  . Chest Pain  . Shortness of Breath   HPI Patient presents with concerns of palpitations and chest pain. Patient has been evaluated multiple times for this there is question of whether an adrenal disease, or other autonomic dysfunction is present as patient has intermittent episodes of sinus tachycardia. Patient states that she is supposed to see a specialist at [facility] for this. She has been admitted in the past and had echoes her heart which were unremarkable. Patient has also had telemetry which has only been remarkable for sinus tachycardia. She has been seen by cardiology and is followed closely by them. She states that today she was in her car driving with her husband ride when she suddenly developed rapid heart rate with associated chest pain. She had some chest pressure that had associated tingling on the right side of her neck and shoulder. She states that she does not have pain but does have some chest discomfort. She felt like she could not breathe but did not have any pleuritic chest pain. No hemoptysis. No history of PE or DVT. No leg swelling. Patient did have a recent long trip of 9 hours 2 weeks ago but otherwise no recent surgeries or immobilization.  Past Medical History  Diagnosis Date  . Anxiety   . History of chicken pox   . Allergy   . Tachycardia   . Hypertension   . Migraine   . Thyroid nodule    Past Surgical History  Procedure Laterality Date  . Tonsillectomy    . Stapedes surgery    . Appendectomy     Family History  Problem Relation Age of Onset  . Heart disease      father side of family  . Transient ischemic attack Father   . Hypertension Father   . Hyperlipidemia Father   . Heart Problems      father's side  . Cancer Maternal Grandmother     melanoma, startd as skin cancer on the back  .  Hypertension Mother   . Hyperlipidemia Mother   . Heart disease Maternal Grandfather   . Diabetes Paternal Grandmother    Social History  Substance Use Topics  . Smoking status: Former Smoker    Quit date: 06/03/1990  . Smokeless tobacco: Never Used  . Alcohol Use: Yes   OB History    No data available     Review of Systems  Constitutional: Negative for fever.  Allergic/Immunologic: Negative for immunocompromised state.  All other systems reviewed and are negative.     Allergies  Review of patient's allergies indicates no known allergies.  Home Medications   Prior to Admission medications   Medication Sig Start Date End Date Taking? Authorizing Provider  Cholecalciferol (VITAMIN D-3 PO) Take 1 tablet by mouth daily.   Yes Historical Provider, MD  ibuprofen (ADVIL,MOTRIN) 200 MG tablet Take 400 mg by mouth every 6 (six) hours as needed for mild pain.   Yes Historical Provider, MD  LORazepam (ATIVAN) 1 MG tablet Take 1 tablet (1 mg total) by mouth 2 (two) times daily as needed for anxiety. 07/03/15  Yes Danelle Berry, PA-C  magnesium 30 MG tablet Take 30 mg by mouth 2 (two) times daily.   Yes Historical Provider, MD  metoprolol tartrate (LOPRESSOR) 25 MG tablet Take 0.5 tablets (  12.5 mg total) by mouth 2 (two) times daily. Ok to take an extra 1/2 or 1 tab as needed for palpitations 05/18/15  Yes Rhonda G Barrett, PA-C  Multiple Vitamin (MULTIVITAMIN WITH MINERALS) TABS tablet Take 1 tablet by mouth daily.   Yes Historical Provider, MD  NON FORMULARY Take 1 capsule by mouth every evening. Bio-Hormone Replacement Therapy Vitamins Progesterone equivalent   Yes Historical Provider, MD  Omega-3 Fatty Acids (FISH OIL) 1000 MG CAPS Take 2,000 mg by mouth daily.   Yes Historical Provider, MD  progesterone (PROMETRIUM) 100 MG capsule Take 200 mg by mouth at bedtime.  05/22/15  Yes Historical Provider, MD  SUMAtriptan (IMITREX) 50 MG tablet Take 1 tablet (50 mg total) by mouth every 2 (two)  hours as needed for migraine. May repeat in 2 hours if headache persists or recurs. 06/03/15  Yes [name], MD  Thyroid (NATURE-THROID) 48.75 MG TABS Take 1 tablet by mouth daily.   Yes Historical Provider, MD  citalopram (CELEXA) 10 MG tablet Take 1 tablet (10 mg total) by mouth daily. Patient not taking: Reported on 07/03/2015 06/03/15   [name], MD   BP 121/79 mmHg  Pulse 66  Temp(Src) 98.2 F (36.8 C) (Oral)  Resp 18  SpO2 99%  LMP 07/09/2015 Physical Exam  Constitutional: She appears well-developed and well-nourished. No distress.  HENT:  Head: Normocephalic and atraumatic.  Eyes: Conjunctivae are normal. Right eye exhibits no discharge. Left eye exhibits no discharge.  Neck: Normal range of motion. Neck supple. No JVD present.  Cardiovascular: Normal rate, regular rhythm, normal heart sounds and intact distal pulses.   No murmur heard. Pulmonary/Chest: Effort normal and breath sounds normal. No respiratory distress.  Abdominal: Soft. Bowel sounds are normal. She exhibits no distension and no mass. There is no tenderness. There is no rebound and no guarding.  Musculoskeletal: She exhibits no edema (no LE edema).  Neurological: She is alert.  Skin: Skin is warm. No rash noted.  Psychiatric: She has a normal mood and affect.  Nursing note and vitals reviewed.   ED Course  Procedures (including critical care time) Labs Review Labs Reviewed  BASIC METABOLIC PANEL - Abnormal; Notable for the following:    Glucose, Bld 102 (*)    Calcium 10.5 (*)    All other components within normal limits  CBC  D-DIMER, QUANTITATIVE (NOT AT [facility])  Rosezena Sensor, ED    Imaging Review Dg Chest 2 View  08/06/2015  CLINICAL DATA:  Chest pain and shortness of breath for several hours. Hypertension. Inappropriate sinus tachycardia. EXAM: CHEST  2 VIEW COMPARISON:  07/18/2015 FINDINGS: The heart size and mediastinal contours are within normal limits. Both lungs are clear. The  visualized skeletal structures are unremarkable. IMPRESSION: Negative.  No active cardiopulmonary disease. Electronically Signed   By: Myles Rosenthal M.D.   On: 08/06/2015 22:30   I have personally reviewed and evaluated these images and lab results as part of my medical decision-making.   EKG Interpretation   Date/Time:  Friday August 06 2015 21:32:58 EDT Ventricular Rate:  87 PR Interval:  148 QRS Duration: 74 QT Interval:  350 QTC Calculation: 421 R Axis:   71 Text Interpretation:  Normal sinus rhythm Low voltage QRS T wave  abnormality Artifact Abnormal ekg Confirmed by Gerhard Munch  MD ([id number])  on 08/06/2015 9:53:34 PM      MDM   Final diagnoses:  None    Unremarkable CBC, BMP, troponin 0.02. Patient's chest x-ray  is negative for acute cardiopulmonary disease. Doubt dissection, doubt esophageal perforation patient does have some diarrhea but this has been ongoing and her abdominal exam is benign. Doubt acute abdominal process. Fevers no chills to suggest an infectious process. No murmur on exam. Recent echo unremarkable. Patient's sinus tachycardia has not progressed to a danger to rhythm on multiple telemetry and evaluations in the past. Pending d-dimer given long drive.  D-dimer elevated. Pending CTA. Care of pt transferred to Dr. Rubin PayorPickering at 1am.     Sidney AceAlison Charruf Kenneth Cuaresma, MD 08/07/15 [id number]  Gerhard Munchobert Lockwood, MD 08/08/15 0030  Gerhard Munchobert Lockwood, MD 08/08/15 [phone]

## 2015-08-07 NOTE — ED Notes (Signed)
Pt sts tingling and numbness in face has resolved.

## 2015-08-07 NOTE — ED Provider Notes (Signed)
  Physical Exam  BP 122/87 mmHg  Pulse 98  Temp(Src) 98.2 F (36.8 C) (Oral)  Resp 15  SpO2 100%  LMP 07/09/2015  Physical Exam  ED Course  Procedures  MDM Patient was shortness breath and chest pain. Positive d-dimer. Negative CT scan show. Patient did have a episode of tachycardia appeared to be sinus. Will discharge home and she has follow-up.      Benjiman CoreNathan Yazeed Pryer, MD 08/07/15 [phone]

## 2015-08-17 ENCOUNTER — Emergency Department (HOSPITAL_COMMUNITY)
Admission: EM | Admit: 2015-08-17 | Discharge: 2015-08-17 | Discharge status: Against Medical Advice | Attending: Dermatology | Admitting: Dermatology

## 2015-08-17 ENCOUNTER — Telehealth: Payer: Self-pay | Admitting: Internal Medicine

## 2015-08-17 ENCOUNTER — Telehealth: Payer: Self-pay | Admitting: Family Medicine

## 2015-08-17 ENCOUNTER — Encounter (HOSPITAL_COMMUNITY): Payer: Self-pay | Admitting: *Deleted

## 2015-08-17 ENCOUNTER — Emergency Department (HOSPITAL_COMMUNITY)

## 2015-08-17 DIAGNOSIS — R0789 Other chest pain: Secondary | ICD-10-CM | POA: Insufficient documentation

## 2015-08-17 DIAGNOSIS — Z5321 Procedure and treatment not carried out due to patient leaving prior to being seen by health care provider: Secondary | ICD-10-CM | POA: Diagnosis not present

## 2015-08-17 DIAGNOSIS — G478 Other sleep disorders: Secondary | ICD-10-CM

## 2015-08-17 DIAGNOSIS — Z87891 Personal history of nicotine dependence: Secondary | ICD-10-CM | POA: Diagnosis not present

## 2015-08-17 DIAGNOSIS — R002 Palpitations: Secondary | ICD-10-CM | POA: Insufficient documentation

## 2015-08-17 LAB — I-STAT TROPONIN, ED: Troponin i, poc: 0 ng/mL (ref 0.00–0.08)

## 2015-08-17 LAB — BASIC METABOLIC PANEL
ANION GAP: 4 — AB (ref 5–15)
BUN: 13 mg/dL (ref 6–20)
CO2: 24 mmol/L (ref 22–32)
Calcium: 10.2 mg/dL (ref 8.9–10.3)
Chloride: 109 mmol/L (ref 101–111)
Creatinine, Ser: 0.82 mg/dL (ref 0.44–1.00)
GFR calc Af Amer: >60 mL/min (ref >60)
Glucose, Bld: 107 mg/dL — ABNORMAL HIGH (ref 65–99)
POTASSIUM: 4 mmol/L (ref 3.5–5.1)
SODIUM: 137 mmol/L (ref 135–145)

## 2015-08-17 LAB — CBC
HEMATOCRIT: 41.3 % (ref 36.0–46.0)
HEMOGLOBIN: 14 g/dL (ref 12.0–15.0)
MCH: 32 pg (ref 26.0–34.0)
MCHC: 33.9 g/dL (ref 30.0–36.0)
MCV: 94.3 fL (ref 78.0–100.0)
Platelets: 362 10*3/uL (ref 150–400)
RBC: 4.38 MIL/uL (ref 3.87–5.11)
RDW: 12.3 % (ref 11.5–15.5)
WBC: 8.2 10*3/uL (ref 4.0–10.5)

## 2015-08-17 LAB — TSH: TSH: 4.327 u[IU]/mL (ref 0.350–4.500)

## 2015-08-17 NOTE — ED Notes (Signed)
Patients heart rate 77 at this time.  States she can feel that it has gone down,  Has [facility] to begin work up for heart palpitations

## 2015-08-17 NOTE — ED Notes (Signed)
Patient presents tearful with c/o rapid heartbeat, central chest pressure with pain going up her neck and back

## 2015-08-17 NOTE — Telephone Encounter (Signed)
New Message  Pt calling requesting to speak with RN about blood work that was taken in the Hospital on 6/13

## 2015-08-17 NOTE — Telephone Encounter (Signed)
Pt states she was seen in the ED last night and had lab work done.  Patient is concerning about her testosterone levels and the thickness of your blood.  She is requesting a call back to find out what her results were from the lab work done at ED.

## 2015-08-17 NOTE — ED Notes (Signed)
Patient not in the waiting room when looking to do vital signs.

## 2015-08-17 NOTE — Telephone Encounter (Signed)
Thanks .Marland Kitchen. I reviewed labs and ED note. No further suggestions. [facility].  Dr. HRexene Edison

## 2015-08-17 NOTE — Telephone Encounter (Signed)
Reviewed call w/ patient. She went to ED for palps she has been having - left before completed encounter. She had preliminary chest pain protocol labwork drawn there which included neg troponins, other labwork WNR. Notes she has her f/u w [facility] in 1 week.  Aware I will sent to Dr. Rennis GoldenHilty to review labwork from 6/13 and give any advice. Recommended to keep [facility] follow up for investigation of her arrythmias. Pt voiced understanding.

## 2015-08-18 ENCOUNTER — Emergency Department (HOSPITAL_COMMUNITY)
Admission: EM | Admit: 2015-08-18 | Discharge: 2015-08-19 | Disposition: A | Discharge status: Home / Self Care | Attending: Emergency Medicine | Admitting: Emergency Medicine

## 2015-08-18 DIAGNOSIS — R6883 Chills (without fever): Secondary | ICD-10-CM | POA: Diagnosis not present

## 2015-08-18 DIAGNOSIS — R000 Tachycardia, unspecified: Secondary | ICD-10-CM

## 2015-08-18 DIAGNOSIS — Z87891 Personal history of nicotine dependence: Secondary | ICD-10-CM | POA: Diagnosis not present

## 2015-08-18 DIAGNOSIS — M629 Disorder of muscle, unspecified: Secondary | ICD-10-CM | POA: Diagnosis not present

## 2015-08-18 DIAGNOSIS — Z79899 Other long term (current) drug therapy: Secondary | ICD-10-CM | POA: Insufficient documentation

## 2015-08-18 DIAGNOSIS — R002 Palpitations: Secondary | ICD-10-CM | POA: Diagnosis present

## 2015-08-18 DIAGNOSIS — R110 Nausea: Secondary | ICD-10-CM | POA: Diagnosis not present

## 2015-08-18 DIAGNOSIS — R05 Cough: Secondary | ICD-10-CM | POA: Insufficient documentation

## 2015-08-18 NOTE — Telephone Encounter (Signed)
Testosterone level was not done and blood count (thickness) is fine

## 2015-08-18 NOTE — Telephone Encounter (Signed)
Called patient and LMOVM to return call.     

## 2015-08-19 ENCOUNTER — Ambulatory Visit: Admitting: Family Medicine

## 2015-08-19 ENCOUNTER — Emergency Department (HOSPITAL_COMMUNITY)

## 2015-08-19 ENCOUNTER — Encounter (HOSPITAL_COMMUNITY): Payer: Self-pay | Admitting: Emergency Medicine

## 2015-08-19 LAB — CBC
HCT: 41 % (ref 36.0–46.0)
Hemoglobin: 14.2 g/dL (ref 12.0–15.0)
MCH: 32.6 pg (ref 26.0–34.0)
MCHC: 34.6 g/dL (ref 30.0–36.0)
MCV: 94 fL (ref 78.0–100.0)
Platelets: 353 K/uL (ref 150–400)
RBC: 4.36 MIL/uL (ref 3.87–5.11)
RDW: 12.4 % (ref 11.5–15.5)
WBC: 10.5 K/uL (ref 4.0–10.5)

## 2015-08-19 LAB — BASIC METABOLIC PANEL WITH GFR
Anion gap: 4 — ABNORMAL LOW (ref 5–15)
BUN: 18 mg/dL (ref 6–20)
CO2: 26 mmol/L (ref 22–32)
Calcium: 10.3 mg/dL (ref 8.9–10.3)
Chloride: 107 mmol/L (ref 101–111)
Creatinine, Ser: 0.73 mg/dL (ref 0.44–1.00)
GFR calc Af Amer: >60 mL/min
GFR calc non Af Amer: >60 mL/min
Glucose, Bld: 103 mg/dL — ABNORMAL HIGH (ref 65–99)
Potassium: 5 mmol/L (ref 3.5–5.1)
Sodium: 137 mmol/L (ref 135–145)

## 2015-08-19 LAB — I-STAT TROPONIN, ED: Troponin i, poc: 0 ng/mL (ref 0.00–0.08)

## 2015-08-19 MED ORDER — PROPRANOLOL HCL 10 MG PO TABS: 10.0000 mg | ORAL_TABLET | Freq: Two times a day (BID) | ORAL | Status: DC | PRN

## 2015-08-19 NOTE — ED Provider Notes (Signed)
CSN: [id number]     Arrival date & time 08/18/15  2327 History  By signing my name below, I, Pamela Short, attest that this documentation has been prepared under the direction and in the presence of Pamela Boozeavid Baylin Gamblin, MD. Electronically Signed: Angelene GiovanniEmmanuella Short, ED Scribe. 08/19/2015. 12:58 AM.     Chief Complaint  Patient presents with  . Palpitations   Patient is a 45 y.o. female presenting with palpitations. The history is provided by the patient. No language interpreter was used.  Palpitations Palpitations quality:  Fast Onset quality:  At rest Timing:  Intermittent Progression:  Worsening Chronicity:  Chronic Context: anxiety   Relieved by:  Nothing Worsened by:  Nothing Ineffective treatments:  Beta blockers Associated symptoms: chest pressure, cough, nausea and shortness of breath   Associated symptoms: no dizziness and no weakness    HPI Comments: Pamela Short is a 45 y.o. female with a hx of anxiety, tachycardia, and thyroid nodule who presents to the Emergency Department complaining of an episode of sudden onset of moderate palpitations and chest pressure with warmth radiating throughout her chest that woke her up from sleep PTA. She notes that her chest pressure feels as though something is sitting on her chest and the episode started with left posterior shoulder pain. She reports associated chills, coldness to fingers and toes, difficulty breathing, nausea, and diarrhea with this episode. She states that her HR was approx.112 during this episode. She explains that she currently feels congested and has a mild persistent cough. She adds that she has been having unexpected weight loss and only sleeping approx. 3 hours a night but sometimes having a BM relieves these symptoms. Pt has been evaluated multiple times for these intermittent episodes since February 2017 and has an upcoming appointment [facility] a [facility] 08/[facility]/15. Pt is currently on Metoprolol and Ativan, which she  took PTA with no relief. No abdominal pain, weakness, or dizziness.   Past Medical History  Diagnosis Date  . Anxiety   . History of chicken pox   . Allergy   . Tachycardia   . Migraine   . Thyroid nodule    Past Surgical History  Procedure Laterality Date  . Tonsillectomy    . Stapedes surgery    . Appendectomy     Family History  Problem Relation Age of Onset  . Heart disease      father side of family  . Transient ischemic attack Father   . Hypertension Father   . Hyperlipidemia Father   . Heart Problems      father's side  . Cancer Maternal Grandmother     melanoma, startd as skin cancer on the back  . Hypertension Mother   . Hyperlipidemia Mother   . Heart disease Maternal Grandfather   . Diabetes Paternal Grandmother    Social History  Substance Use Topics  . Smoking status: Former Smoker    Quit date: 06/03/1990  . Smokeless tobacco: Never Used  . Alcohol Use: Yes   OB History    No data available     Review of Systems  Constitutional: Positive for chills.  Respiratory: Positive for cough and shortness of breath.   Cardiovascular: Positive for palpitations.  Gastrointestinal: Positive for nausea. Negative for abdominal pain.  Neurological: Negative for dizziness and weakness.  All other systems reviewed and are negative.     Allergies  Review of patient's allergies indicates no known allergies.  Home Medications   Prior to Admission medications  Medication Sig Start Date End Date Taking? Authorizing Provider  Cholecalciferol (VITAMIN D-3 PO) Take 1 tablet by mouth daily.    Historical Provider, MD  citalopram (CELEXA) 10 MG tablet Take 1 tablet (10 mg total) by mouth daily. Patient not taking: Reported on 07/03/2015 06/03/15   [name], MD  ibuprofen (ADVIL,MOTRIN) 200 MG tablet Take 400 mg by mouth every 6 (six) hours as needed for mild pain.    Historical Provider, MD  LORazepam (ATIVAN) 1 MG tablet Take 1 tablet (1 mg total) by  mouth 2 (two) times daily as needed for anxiety. 07/03/15   Danelle Berry, PA-C  magnesium 30 MG tablet Take 30 mg by mouth 2 (two) times daily.    Historical Provider, MD  metoprolol tartrate (LOPRESSOR) 25 MG tablet Take 0.5 tablets (12.5 mg total) by mouth 2 (two) times daily. Ok to take an extra 1/2 or 1 tab as needed for palpitations 05/18/15   [name], PA-C  Multiple Vitamin (MULTIVITAMIN WITH MINERALS) TABS tablet Take 1 tablet by mouth daily.    Historical Provider, MD  NON FORMULARY Take 1 capsule by mouth every evening. Bio-Hormone Replacement Therapy Vitamins Progesterone equivalent    Historical Provider, MD  Omega-3 Fatty Acids (FISH OIL) 1000 MG CAPS Take 2,000 mg by mouth daily.    Historical Provider, MD  progesterone (PROMETRIUM) 100 MG capsule Take 200 mg by mouth at bedtime.  05/22/15   Historical Provider, MD  SUMAtriptan (IMITREX) 50 MG tablet Take 1 tablet (50 mg total) by mouth every 2 (two) hours as needed for migraine. May repeat in 2 hours if headache persists or recurs. 06/03/15   [name], MD  Thyroid (NATURE-THROID) 48.75 MG TABS Take 1 tablet by mouth daily.    Historical Provider, MD   BP 123/88 mmHg  Pulse 84  Temp(Src) 98.3 F (36.8 C) (Oral)  Resp 17  SpO2 100%  LMP 08/08/2015 (Exact Date) Physical Exam  Constitutional: She is oriented to person, place, and time. She appears well-developed and well-nourished.  HENT:  Head: Normocephalic and atraumatic.  Eyes: EOM are normal. Pupils are equal, round, and reactive to light.  Neck: Normal range of motion. Neck supple. No JVD present.  Cardiovascular: Normal rate, regular rhythm and normal heart sounds.   No murmur heard. Pulmonary/Chest: Effort normal and breath sounds normal. She has no wheezes. She has no rales. She exhibits no tenderness.  Abdominal: Soft. Bowel sounds are normal. She exhibits no distension and no mass. There is no tenderness.  Musculoskeletal: Normal range of motion. She  exhibits no edema.  Lymphadenopathy:    She has no cervical adenopathy.  Neurological: She is alert and oriented to person, place, and time. No cranial nerve deficit. She exhibits abnormal muscle tone. Coordination normal.  Skin: Skin is warm and dry. No rash noted.  Psychiatric: She has a normal mood and affect. Her behavior is normal. Judgment and thought content normal.  Nursing note and vitals reviewed.   ED Course  Procedures (including critical care time) DIAGNOSTIC STUDIES: Oxygen Saturation is 100% on RA, normal by my interpretation.    COORDINATION OF CARE: 12:56 AM- Pt advised of plan for treatment and pt agrees. Pt informed of lab and x-ray results. Advised to increase Metoprolol dose to 1 tablet BID.    Labs Review Results for orders placed or performed during the hospital encounter of 08/18/15  Basic metabolic panel  Result Value Ref Range   Sodium 137 135 - 145  mmol/L   Potassium 5.0 3.5 - 5.1 mmol/L   Chloride 107 101 - 111 mmol/L   CO2 26 22 - 32 mmol/L   Glucose, Bld 103 (H) 65 - 99 mg/dL   BUN 18 6 - 20 mg/dL   Creatinine, Ser 4.09 0.44 - 1.00 mg/dL   Calcium 81.1 8.9 - 91.4 mg/dL   GFR calc non Af Amer >60 >60 mL/min   GFR calc Af Amer >60 >60 mL/min   Anion gap 4 (L) 5 - 15  CBC  Result Value Ref Range   WBC 10.5 4.0 - 10.5 K/uL   RBC 4.36 3.87 - 5.11 MIL/uL   Hemoglobin 14.2 12.0 - 15.0 g/dL   HCT 78.2 95.6 - 21.3 %   MCV 94.0 78.0 - 100.0 fL   MCH 32.6 26.0 - 34.0 pg   MCHC 34.6 30.0 - 36.0 g/dL   RDW 08.6 57.8 - 46.9 %   Platelets 353 150 - 400 K/uL  I-stat troponin, ED  Result Value Ref Range   Troponin i, poc 0.00 0.00 - 0.08 ng/mL   Comment 3            Imaging Review Dg Chest 2 View  08/19/2015  CLINICAL DATA:  Chronic tachycardia and upper mid chest pain. Initial encounter. EXAM: CHEST  2 VIEW COMPARISON:  Chest radiograph performed 08/17/2015 FINDINGS: The lungs are well-aerated and clear. There is no evidence of focal opacification,  pleural effusion or pneumothorax. The heart is normal in size; the mediastinal contour is within normal limits. No acute osseous abnormalities are seen. IMPRESSION: No acute cardiopulmonary process seen. Electronically Signed   By: Roanna Raider M.D.   On: 08/19/2015 00:23   Dg Chest 2 View  08/17/2015  CLINICAL DATA:  Acute onset of mid chest pain.  Initial encounter. EXAM: CHEST  2 VIEW COMPARISON:  Chest radiograph performed 08/06/2015, and CTA of the chest performed 08/07/2015 FINDINGS: The lungs are well-aerated and clear. There is no evidence of focal opacification, pleural effusion or pneumothorax. The heart is normal in size; the mediastinal contour is within normal limits. No acute osseous abnormalities are seen. IMPRESSION: No acute cardiopulmonary process seen. Electronically Signed   By: Roanna Raider M.D.   On: 08/17/2015 02:55   Pamela Booze, MD has personally reviewed and evaluated these images and lab results as part of his medical decision-making.   EKG Interpretation   Date/Time:  Wednesday August 18 2015 23:59:41 EDT Ventricular Rate:  79 PR Interval:  145 QRS Duration: 94 QT Interval:  362 QTC Calculation: 415 R Axis:   57 Text Interpretation:  Duplicate Discard Confirmed by Preston Fleeting  MD, Ryot Burrous  ([id number]) on 08/19/2015 12:30:28 AM      MDM   Final diagnoses:  Palpitations    Episodes of palpitations that seem to be part of an autonomic syndrome. She is noted to have episodes of tachycardia with diarrhea and she has also been having some weight loss. This is somewhat suspicious for hyperthyroidism. However, she does state that another physician has done thyroid studies on her including free T4 and free T3 and she suggested possible hypothyroidism. Old records are reviewed and she has been evaluated by cardiology who is actually referring her to a clinic for evaluation of autonomic dysfunction. Workup in the ED is unremarkable and ECG is unremarkable. I had a long discussion  with patient and with her spouse. She is on a very low-dose of metoprolol and is been taking metoprolol on a prn  basis in addition to the routine dose. I have recommended that she increase her metoprolol from 12.5 mg twice a day to 25 mg twice a day. She is given a prescription for propanolol to use if she has additional episodes of tachycardia. This should have a quicker onset and briefer duration of action which would be appropriate for prn usage. She is to keep her appointment at [facility] and follow-up with her cardiologist here.  I personally performed the services described in this documentation, which was scribed in my presence. The recorded information has been reviewed and is accurate.      Pamela Booze, MD 08/19/15 [phone]

## 2015-08-19 NOTE — Telephone Encounter (Signed)
Referral placed.

## 2015-08-19 NOTE — ED Notes (Signed)
Pt reports understanding of discharge information. No questions at time of discharge 

## 2015-08-19 NOTE — ED Notes (Signed)
Patient transported to X-ray 

## 2015-08-19 NOTE — Telephone Encounter (Signed)
Spoke with pt and advised of the results. She is going to follow up with [facility]- appt for today canceled, she was advised by ED that she should have sleep study and neuro referral. Ok to place? If so please advise of the Dx

## 2015-08-19 NOTE — ED Notes (Signed)
Pt states she was asleep tonight and woke up with chest pain, left shoulder pain, and pain in her back around her shoulder blade  Pt states her heart started racing then she had to have a bowel movement   Pt states she feels like her chest is heavy and constricting  Pt states these episodes started in Feb and she has been seeing a cardiologist, Dr Rennis GoldenHilty with [facility] and next week is going to see someone in [place] for these episodes  Pt states she can feel the episodes coming on and she comes in when she cannot get them under control at home  Pt is tearful in triage

## 2015-08-19 NOTE — Telephone Encounter (Signed)
The ER note does not mention a sleep study nor a neuro referral.  I will be happy to [facility] (dx abnormal sleep pattern) as this will cover both of her concerns (rather than the usual pulmonary referral)

## 2015-08-19 NOTE — ED Notes (Signed)
Pt reports to having palpitations that occurred during rest. Pt reports being seen by cardiology and is scheduled [facility].

## 2015-08-19 NOTE — Discharge Instructions (Signed)
Increase your metoprolol to a full tablet twice a day. Take propanolol as needed when your heart is racing.  Palpitations A palpitation is the feeling that your heartbeat is irregular or is faster than normal. It may feel like your heart is fluttering or skipping a beat. Palpitations are usually not a serious problem. However, in some cases, you may need further medical evaluation. CAUSES  Palpitations can be caused by:  Smoking.  Caffeine or other stimulants, such as diet pills or energy drinks.  Alcohol.  Stress and anxiety.  Strenuous physical activity.  Fatigue.  Certain medicines.  Heart disease, especially if you have a history of irregular heart rhythms (arrhythmias), such as atrial fibrillation, atrial flutter, or supraventricular tachycardia.  An improperly working pacemaker or defibrillator. DIAGNOSIS  To find the cause of your palpitations, your health care provider will take your medical history and perform a physical exam. Your health care provider may also have you take a test called an ambulatory electrocardiogram (ECG). An ECG records your heartbeat patterns over a 24-hour period. You may also have other tests, such as:  Transthoracic echocardiogram (TTE). During echocardiography, sound waves are used to evaluate how blood flows through your heart.  Transesophageal echocardiogram (TEE).  Cardiac monitoring. This allows your health care provider to monitor your heart rate and rhythm in real time.  Holter monitor. This is a portable device that records your heartbeat and can help diagnose heart arrhythmias. It allows your health care provider to track your heart activity for several days, if needed.  Stress tests by exercise or by giving medicine that makes the heart beat faster. TREATMENT  Treatment of palpitations depends on the cause of your symptoms and can vary greatly. Most cases of palpitations do not require any treatment other than time, relaxation, and  monitoring your symptoms. Other causes, such as atrial fibrillation, atrial flutter, or supraventricular tachycardia, usually require further treatment. HOME CARE INSTRUCTIONS   Avoid:  Caffeinated coffee, tea, soft drinks, diet pills, and energy drinks.  Chocolate.  Alcohol.  Stop smoking if you smoke.  Reduce your stress and anxiety. Things that can help you relax include:  A method of controlling things in your body, such as your heartbeats, with your mind (biofeedback).  Yoga.  Meditation.  Physical activity such as swimming, jogging, or walking.  Get plenty of rest and sleep. SEEK MEDICAL CARE IF:   You continue to have a fast or irregular heartbeat beyond 24 hours.  Your palpitations occur more often. SEEK IMMEDIATE MEDICAL CARE IF:  You have chest pain or shortness of breath.  You have a severe headache.  You feel dizzy or you faint. MAKE SURE YOU:  Understand these instructions.  Will watch your condition.  Will get help right away if you are not doing well or get worse.   This information is not intended to replace advice given to you by your health care provider. Make sure you discuss any questions you have with your health care provider.   Document Released: 02/18/2000 Document Revised: 02/25/2013 Document Reviewed: 04/21/2011 Elsevier Interactive Patient Education 2016 Elsevier Inc.  Propranolol tablets What is this medicine? PROPRANOLOL (proe PRAN oh lole) is a beta-blocker. Beta-blockers reduce the workload on the heart and help it to beat more regularly. This medicine is used to treat high blood pressure, to control irregular heart rhythms (arrhythmias) and to relieve chest pain caused by angina. It may also be helpful after a heart attack. This medicine is also used to prevent migraine  headaches, relieve uncontrollable shaking (tremors), and help certain problems related to the thyroid gland and adrenal gland. This medicine may be used for other  purposes; ask your health care provider or pharmacist if you have questions. What should I tell my health care provider before I take this medicine? They need to know if you have any of these conditions: -circulation problems or blood vessel disease -diabetes -history of heart attack or heart disease, vasospastic angina -kidney disease -liver disease -lung or breathing disease, like asthma or emphysema -pheochromocytoma -slow heart rate -thyroid disease -an unusual or allergic reaction to propranolol, other beta-blockers, medicines, foods, dyes, or preservatives -pregnant or trying to get pregnant -breast-feeding How should I use this medicine? Take this medicine by mouth with a glass of water. Follow the directions on the prescription label. Take your doses at regular intervals. Do not take your medicine more often than directed. Do not stop taking except on your the advice of your doctor or health care professional. Talk to your pediatrician regarding the use of this medicine in children. Special care may be needed. Overdosage: If you think you have taken too much of this medicine contact a poison control center or emergency room at once. NOTE: This medicine is only for you. Do not share this medicine with others. What if I miss a dose? If you miss a dose, take it as soon as you can. If it is almost time for your next dose, take only that dose. Do not take double or extra doses. What may interact with this medicine? Do not take this medicine with any of the following medications: -feverfew -phenothiazines like chlorpromazine, mesoridazine, prochlorperazine, thioridazine This medicine may also interact with the following medications: -aluminum hydroxide gel -antipyrine -antiviral medicines for HIV or AIDS -barbiturates like phenobarbital -certain medicines for blood pressure, heart disease, irregular heart  beat -cimetidine -ciprofloxacin -diazepam -fluconazole -haloperidol -isoniazid -medicines for cholesterol like cholestyramine or colestipol -medicines for mental depression -medicines for migraine headache like almotriptan, eletriptan, frovatriptan, naratriptan, rizatriptan, sumatriptan, zolmitriptan -NSAIDs, medicines for pain and inflammation, like ibuprofen or naproxen -phenytoin -rifampin -teniposide -theophylline -thyroid medicines -tolbutamide -warfarin -zileuton This list may not describe all possible interactions. Give your health care provider a list of all the medicines, herbs, non-prescription drugs, or dietary supplements you use. Also tell them if you smoke, drink alcohol, or use illegal drugs. Some items may interact with your medicine. What should I watch for while using this medicine? Visit your doctor or health care professional for regular check ups. Check your blood pressure and pulse rate regularly. Ask your health care professional what your blood pressure and pulse rate should be, and when you should contact them. You may get drowsy or dizzy. Do not drive, use machinery, or do anything that needs mental alertness until you know how this drug affects you. Do not stand or sit up quickly, especially if you are an older patient. This reduces the risk of dizzy or fainting spells. Alcohol can make you more drowsy and dizzy. Avoid alcoholic drinks. This medicine can affect blood sugar levels. If you have diabetes, check with your doctor or health care professional before you change your diet or the dose of your diabetic medicine. Do not treat yourself for coughs, colds, or pain while you are taking this medicine without asking your doctor or health care professional for advice. Some ingredients may increase your blood pressure. What side effects may I notice from receiving this medicine? Side effects that you should report to  your doctor or health care professional as soon as  possible: -allergic reactions like skin rash, itching or hives, swelling of the face, lips, or tongue -breathing problems -changes in blood sugar -cold hands or feet -difficulty sleeping, nightmares -dry peeling skin -hallucinations -muscle cramps or weakness -slow heart rate -swelling of the legs and ankles -vomiting Side effects that usually do not require medical attention (report to your doctor or health care professional if they continue or are bothersome): -change in sex drive or performance -diarrhea -dry sore eyes -hair loss -nausea -weak or tired This list may not describe all possible side effects. Call your doctor for medical advice about side effects. You may report side effects to FDA at 1-800-FDA-1088. Where should I keep my medicine? Keep out of the reach of children. Store at room temperature between 15 and 30 degrees C (59 and 86 degrees F). Protect from light. Throw away any unused medicine after the expiration date. NOTE: This sheet is a summary. It may not cover all possible information. If you have questions about this medicine, talk to your doctor, pharmacist, or health care provider.    2016, Elsevier/Gold Standard. (2012-10-25 14:51:53)

## 2015-08-23 ENCOUNTER — Ambulatory Visit (INDEPENDENT_AMBULATORY_CARE_PROVIDER_SITE_OTHER): Admitting: Neurology

## 2015-08-23 ENCOUNTER — Encounter: Payer: Self-pay | Admitting: Neurology

## 2015-08-23 VITALS — BP 110/60 | HR 78 | Resp 20 | Ht 63.5 in | Wt 139.0 lb

## 2015-08-23 DIAGNOSIS — G4710 Hypersomnia, unspecified: Secondary | ICD-10-CM | POA: Diagnosis not present

## 2015-08-23 DIAGNOSIS — F513 Sleepwalking [somnambulism]: Secondary | ICD-10-CM | POA: Diagnosis not present

## 2015-08-23 DIAGNOSIS — I4729 Other ventricular tachycardia: Secondary | ICD-10-CM

## 2015-08-23 DIAGNOSIS — R6889 Other general symptoms and signs: Secondary | ICD-10-CM

## 2015-08-23 DIAGNOSIS — F518 Other sleep disorders not due to a substance or known physiological condition: Secondary | ICD-10-CM

## 2015-08-23 DIAGNOSIS — G4753 Recurrent isolated sleep paralysis: Secondary | ICD-10-CM | POA: Diagnosis not present

## 2015-08-23 DIAGNOSIS — G4730 Sleep apnea, unspecified: Secondary | ICD-10-CM | POA: Diagnosis not present

## 2015-08-23 DIAGNOSIS — I472 Ventricular tachycardia: Secondary | ICD-10-CM | POA: Diagnosis not present

## 2015-08-23 NOTE — Progress Notes (Signed)
SLEEP MEDICINE CLINIC   Provider:  Melvyn Novasarmen  Jaleea Alesi, M D  Referring Provider: Sheliah Hatchabori, Katherine E, MD Primary Care Physician:  [name], MD  Chief Complaint  Patient presents with  . New Patient (Initial Visit)    SVT, wakes up with a start, never had sleep study    HPI:  Pamela Short is a 45 y.o. female , seen here as a referral from Dr. Beverely Lowabori for a sleep evaluation.   Patient presented to the hospital in February 2017 with palpitations, heart racing and a feeling of holding her breath at night.  Since that time she has been visiting the ED several times, with HR up to 167 bpm!  Yesterday, she was sunbathing and fell asleep, but woke with a startle. She seems to have sinus tachycardia and had an extensive workup with Dr. [name] [facility]. She [facility] another [facility]. The episodes have become more and more frequent since February and she feels more anxious about them. She has noticed that she tried to intellectualize the high heart rate episodes, but cannot find a specific stressor or an anxietya at it origin. 4 years prior to today's presentation she did have a panic attack which led to an evaluation including pheochromocytoma workup, anxiety and panic disorder for finally diagnosed in after she was prescribed progesterone and some other hormones the spells seized for about 3 years. Now she feels anxious again not knowing why she has the new tachycardia spells. Her physical health has otherwise been unremarkable except for very soft stools which may be related to anxiety as well. She is afraid of going to sleep, afraid to stop breathing or to have a cardiac event.  Was started on Toprol . The tachycardic episodes were evaluated by Dr. Elvera LennoxGherghe for adrenalin output disorders and was found to be negative for pheochromocytoma.  She also  has some menstrual migraines. Has been prescribed triptans by PCP.  She is  concerned that breath-holding spells may be leading to the brady- tachycardia.   Sleep habits are as follows: The patient feels all the time fatigued and this has led her to have an early at bedtime usually at 8:39 PM. She has however difficulties to initiate sleep. Isolated nights she may have been up until midnight or later. Once she is entering sleep but she will only sleep for periods of 60-90 minutes and frequently wakes up. Each arousal poses another difficulty to go back to sleep. Once or twice at night arousals may be related to the feeling of her heart racing but many arousals are related to the feeling of a breath-holding spell or are not identified. There is no underlying pain disorder that would influence her sleep. She used to rise in the morning at 4.30 to gain time to exercise in the morning but now feels unable to muster the energy. At 6:30 she will try to rise and she feels neither restored or refreshed at that time. She may have averaged 5 hours of sleep on a good night. Nocturia ;none. diarrhea at night- 1-2 times.  Sleep medical history and family sleep history:  Patient has a childhood history of sleepwalking, she had night terrors, she also left the house trying to walk down the street. These episodes lasted until age 89 college, but she still a sleep talker. She also fights in her sleep, kick. 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  Social history: Married, almost 24 ( from his side ) , almost 87, 68 and 45 years old. 6 biological children. 1 step daughter.   Review of Systems: Out of a complete 14 system review, the patient complains of only the following symptoms, and all other reviewed systems are negative.  Epworth score  17 , Fatigue severity score 46  , depression score . Fatigue, chest pain, palpitations, blurred vision, shortness of breath, coughing, feeling cold and cold at times with flushing, dizziness, anxiety  daytime sleepiness.   Social History   Social History  . Marital Status: Married    Spouse Name: N/A  . Number of Children: N/A  . Years of Education: N/A   Occupational History  . Not on file.   Social History Main Topics  . Smoking status: Former Smoker    Quit date: 06/03/1990  . Smokeless tobacco: Never Used  . Alcohol Use: Yes  . Drug Use: No  . Sexual Activity: Yes   Other Topics Concern  . Not on file   Social History Narrative    Family History  Problem Relation Age of Onset  . Heart disease      father side of family  . Transient ischemic attack Father   . Hypertension Father   . Hyperlipidemia Father   . Heart Problems      father's side  . Cancer Maternal Grandmother     melanoma, startd as skin cancer on the back  . Hypertension Mother   . Hyperlipidemia Mother   . Heart disease Maternal Grandfather   . Diabetes Paternal Grandmother     Past Medical History  Diagnosis Date  . Anxiety   . History of chicken pox   . Allergy   . Tachycardia   . Migraine   . Thyroid nodule     Past Surgical History  Procedure Laterality Date  . Tonsillectomy    . Stapedes surgery    . Appendectomy      Current Outpatient Prescriptions  Medication Sig Dispense Refill  . ibuprofen (ADVIL,MOTRIN) 200 MG tablet Take 400 mg by mouth every 6 (six) hours as needed for mild pain.    Marland Kitchen LORazepam (ATIVAN) 1 MG tablet Take 1 tablet (1 mg total) by mouth 2 (two) times daily as needed for anxiety. 10 tablet 0  . magnesium 30 MG tablet Take 30 mg by mouth 2 (two) times daily.    . metoprolol tartrate (LOPRESSOR) 25 MG tablet Take 0.5 tablets (12.5 mg total) by mouth 2 (two) times daily. Ok to take an extra 1/2 or 1 tab as needed for palpitations 60 tablet 3  . Multiple Vitamin (MULTIVITAMIN WITH MINERALS) TABS tablet Take 1 tablet by mouth daily.    . NON FORMULARY Take 1 capsule by mouth every evening. Bio-Hormone Replacement Therapy Vitamins Progesterone equivalent      . Omega-3 Fatty Acids (FISH OIL) 1000 MG CAPS Take 2,000 mg by mouth daily.    . progesterone (PROMETRIUM) 100 MG capsule Take 200 mg by mouth at bedtime.     . SUMAtriptan (IMITREX) 50 MG tablet Take 1 tablet (50 mg total) by mouth every 2 (two) hours as needed for migraine. May repeat in 2 hours if headache persists or recurs. 10 tablet 0  . Thyroid (NATURE-THROID) 48.75 MG TABS Take 1 tablet by mouth daily.    . propranolol (INDERAL) 10 MG tablet Take 1 tablet (10 mg total) by mouth 2 (two) times daily as needed (  rapid heartbeat). (Patient not taking: Reported on 08/23/2015) 10 tablet 0   No current facility-administered medications for this visit.    Allergies as of 08/23/2015  . (No Known Allergies)    Vitals: BP 110/60 mmHg  Pulse 78  Resp 20  Ht 5' 3.5" (1.613 m)  Wt 139 lb (63.05 kg)  BMI 24.23 kg/m2  LMP 08/08/2015 (Exact Date) Last Weight:  Wt Readings from Last 1 Encounters:  08/23/15 139 lb (63.05 kg)   ZOX:WRUE mass index is 24.23 kg/(m^2).     Last Height:   Ht Readings from Last 1 Encounters:  08/23/15 5' 3.5" (1.613 m)    Physical exam:  General: The patient is awake, alert and appears not in acute distress. The patient is well groomed. Head: Normocephalic, atraumatic. Neck is supple. Mallampati 1- uvula deviated to  the right. ,  neck circumference: 14. Nasal airflow patent , TMJ is  evident . Retrognathia is seen.  Cardiovascular:  Regular rate and rhythm, without  murmurs or carotid bruit, and without distended neck veins. Respiratory: Lungs are clear to auscultation. Skin:  Without evidence of edema, or rash Trunk: BMI is. The patient's posture is erect    Neurologic exam : The patient is awake and alert, oriented to place and time.   Memory subjective described as intact.  Attention span & concentration ability appears normal.  Speech is fluent,  without dysarthria, dysphonia or aphasia.  Mood and affect are appropriate.  Cranial nerves: Pupils are  equal and briskly reactive to light. Funduscopic exam without evidence of pallor or edema. Extraocular movements  in vertical and horizontal planes intact and without nystagmus. Visual fields by finger perimetry are intact. Hearing to finger rub intact on the right, left sided hearing loss    Facial sensation intact to fine touch.  Facial motor strength is symmetric and tongue and uvula move midline. Shoulder shrug was symmetrical.   Motor exam:  Normal tone, muscle bulk and symmetric strength in all extremities.  Sensory:  Fine touch, pinprick and vibration were tested in all extremities.  Proprioception tested in the upper extremities was normal.  Coordination: Rapid alternating movements in the fingers/hands was normal.  Finger-to-nose maneuver  normal without evidence of ataxia, dysmetria or tremor.  Gait and station: Patient walks without assistive device and is able unassisted to climb up to the exam table. Strength within normal limits.  Stance is stable and normal.   Deep tendon reflexes: in the  upper and lower extremities are symmetric and intact. Babinski maneuver response is downgoing.  The patient was advised of the nature of the diagnosed sleep disorder , the treatment options and risks for general a health and wellness arising from not treating the condition.  I spent more than 45 minutes of face to face time with the patient. Greater than 50% of time was spent in counseling and coordination of care. We have discussed the diagnosis and differential and I answered the patient's questions.     Assessment:  After physical and neurologic examination, review of laboratory studies,  Personal review of imaging studies, reports of other /same  Imaging studies ,  Results of polysomnography/ neurophysiology testing and pre-existing records as far as provided in visit., my assessment is   1) Mrs. Bole has a history of sleep walking possible night terrors, she does not describe REM sleep  behavior except for sleep talking. Her higher risk for having tachycardic events can be related to abnormal sleep and dream dysfunction. I will  not order a full parasomnia montage but one to have good audio and video recording of the sleep study.  2) Mrs. Nygren's husband has observed her to snore especially in supine position, she also has a partially paralyzed uvula which may make snoring louder not just in supine but on her left side. He has noted breath holding spells not for prolonged periods of time but irregular nocturnal breathing. This could be central or obstructive in nature.  Plan:  Treatment plan and additional workup :  The patient will undergo a split night polysomnography, goal is to capture one of her tachycardia spells which may be related to central or obstructive sleep apnea. Please note that the patient has a history of parasomnia activity in childhood and that audio and video need to be recorded. Revisit after sleep study.     Porfirio Mylar Chantele Corado MD  08/23/2015   CC: [name], Md [address] [place], [place] [address]

## 2015-08-23 NOTE — Patient Instructions (Signed)
Hypersomnia Hypersomnia is when you feel extremely tired during the day even though you're getting plenty of sleep at night. You may need to take naps during the day, and you may also be extremely difficult to wake up when you are sleeping.  CAUSES  The cause of your hypersomnia may not be known. Hypersomnia may be caused by:   Medicines.  Sleep disorders, such as narcolepsy.  Trauma or injury to your head or nervous system.  Using drugs or alcohol.  Tumors.  Medical conditions, such as depression or hypothyroidism.  Genetics. SIGNS AND SYMPTOMS  The main symptoms of hypersomnia include:   Feeling extremely tired throughout the day.  Being very difficult to wake up.  Sleeping for longer and longer periods.  Taking naps throughout the day. Other symptoms may include:   Feeling:  Restless.  Annoyed.  Anxious.  Low energy.  Having difficulty:  Remembering.  Speaking.  Thinking.  Losing your appetite.  Experiencing hallucinations. DIAGNOSIS  Hypersomnia may be diagnosed by:  Medical history and physical exam. This will include a sleep history.  Completing sleep logs.  Tests may also be done, such as:  Polysomnography.  Multiple sleep latency test (MSLT). TREATMENT  There is no cure for hypersomnia, but treatment can be very effective in helping manage the condition. Treatment may include:  Lifestyle and sleeping strategies to help cope with the condition.  Stimulant medicines.  Treating any underlying causes of hypersomnia. HOME CARE INSTRUCTIONS  Take medicines only as directed by your health care provider.  Schedule short naps for when you feel sleepiest during the day. Tell your employer or teachers that you have hypersomnia. You may be able to adjust your schedule to include time for naps.  Avoid drinking alcohol or caffeinated beverages.  Do not eat a heavy meal before bedtime. Eat at about the same times every day.  Do not drive or  operate heavy machinery if you are sleepy.  Do not swim or go out on the water without a life jacket.  If possible, adjust your schedule so that you do not have to work or be active at night.  Keep all follow-up visits as directed by your health care provider. This is important. SEEK MEDICAL CARE IF:   You have new symptoms.  Your symptoms get worse. SEEK IMMEDIATE MEDICAL CARE IF:  You have serious thoughts of hurting yourself or someone else.   This information is not intended to replace advice given to you by your health care provider. Make sure you discuss any questions you have with your health care provider.   Document Released: 02/10/2002 Document Revised: 03/13/2014 Document Reviewed: 09/25/2013 Elsevier Interactive Patient Education 2016 Elsevier Inc.  

## 2015-08-26 ENCOUNTER — Telehealth: Payer: Self-pay | Admitting: Family Medicine

## 2015-08-26 DIAGNOSIS — A6920 Lyme disease, unspecified: Secondary | ICD-10-CM

## 2015-08-26 NOTE — Telephone Encounter (Signed)
Ok for referral to ID 

## 2015-08-26 NOTE — Telephone Encounter (Signed)
Husband states that pt has just received lab work back from [facility] and has tested poss for lyme disease and asking for a referral to infectious disease. I have asked that we get a copy of lab results, husband states he will have them faxed to us.

## 2015-08-26 NOTE — Telephone Encounter (Signed)
Referral placed, please make sure to send records with pt for ID.

## 2015-08-29 ENCOUNTER — Emergency Department (HOSPITAL_COMMUNITY)

## 2015-08-29 ENCOUNTER — Other Ambulatory Visit: Payer: Self-pay

## 2015-08-29 ENCOUNTER — Emergency Department (HOSPITAL_COMMUNITY)
Admission: EM | Admit: 2015-08-29 | Discharge: 2015-08-29 | Disposition: A | Discharge status: Home / Self Care | Attending: Emergency Medicine | Admitting: Emergency Medicine

## 2015-08-29 ENCOUNTER — Encounter (HOSPITAL_COMMUNITY): Payer: Self-pay | Admitting: Emergency Medicine

## 2015-08-29 DIAGNOSIS — Z87891 Personal history of nicotine dependence: Secondary | ICD-10-CM | POA: Insufficient documentation

## 2015-08-29 DIAGNOSIS — R202 Paresthesia of skin: Secondary | ICD-10-CM | POA: Insufficient documentation

## 2015-08-29 DIAGNOSIS — M79602 Pain in left arm: Secondary | ICD-10-CM | POA: Diagnosis present

## 2015-08-29 DIAGNOSIS — I479 Paroxysmal tachycardia, unspecified: Secondary | ICD-10-CM | POA: Diagnosis not present

## 2015-08-29 DIAGNOSIS — R1030 Lower abdominal pain, unspecified: Secondary | ICD-10-CM | POA: Insufficient documentation

## 2015-08-29 DIAGNOSIS — Z791 Long term (current) use of non-steroidal anti-inflammatories (NSAID): Secondary | ICD-10-CM | POA: Insufficient documentation

## 2015-08-29 DIAGNOSIS — Z79899 Other long term (current) drug therapy: Secondary | ICD-10-CM | POA: Diagnosis not present

## 2015-08-29 LAB — BASIC METABOLIC PANEL
ANION GAP: 3 — AB (ref 5–15)
BUN: 17 mg/dL (ref 6–20)
CHLORIDE: 111 mmol/L (ref 101–111)
CO2: 24 mmol/L (ref 22–32)
Calcium: 10.1 mg/dL (ref 8.9–10.3)
Creatinine, Ser: 0.67 mg/dL (ref 0.44–1.00)
GFR calc Af Amer: >60 mL/min (ref >60)
Glucose, Bld: 92 mg/dL (ref 65–99)
POTASSIUM: 4.5 mmol/L (ref 3.5–5.1)
SODIUM: 138 mmol/L (ref 135–145)

## 2015-08-29 LAB — URINALYSIS, ROUTINE W REFLEX MICROSCOPIC
Bilirubin Urine: NEGATIVE
Glucose, UA: NEGATIVE mg/dL
HGB URINE DIPSTICK: NEGATIVE
Ketones, ur: NEGATIVE mg/dL
LEUKOCYTES UA: NEGATIVE
NITRITE: NEGATIVE
PROTEIN: NEGATIVE mg/dL
SPECIFIC GRAVITY, URINE: 1.02 (ref 1.005–1.030)
pH: 5.5 (ref 5.0–8.0)

## 2015-08-29 LAB — CBC
HEMATOCRIT: 40.7 % (ref 36.0–46.0)
HEMOGLOBIN: 14 g/dL (ref 12.0–15.0)
MCH: 32.2 pg (ref 26.0–34.0)
MCHC: 34.4 g/dL (ref 30.0–36.0)
MCV: 93.6 fL (ref 78.0–100.0)
Platelets: 341 10*3/uL (ref 150–400)
RBC: 4.35 MIL/uL (ref 3.87–5.11)
RDW: 12.3 % (ref 11.5–15.5)
WBC: 9.6 10*3/uL (ref 4.0–10.5)

## 2015-08-29 LAB — POC URINE PREG, ED: PREG TEST UR: NEGATIVE

## 2015-08-29 LAB — I-STAT TROPONIN, ED: Troponin i, poc: 0 ng/mL (ref 0.00–0.08)

## 2015-08-29 NOTE — ED Notes (Signed)
Pt comes back to ed, c/o left side chest pain, and reported dizziness and a newly diagnosed case of lyme's diease. Pain score 7 out 10. Pt has been in and out of ed since February looking for answers to her irregular sinus tachycardia. No N/V.  No SOB reported tonight.  Occasional warm flashes.  Triage to be completed in room

## 2015-08-29 NOTE — Discharge Instructions (Signed)
Read the information below.  You may return to the Emergency Department at any time for worsening condition or any new symptoms that concern you.  If you develop worsening chest pain, shortness of breath, fever, you pass out, or become weak or dizzy, return to the ER for a recheck.     Paresthesia Paresthesia is an abnormal burning or prickling sensation. This sensation is generally felt in the hands, arms, legs, or feet. However, it may occur in any part of the body. Usually, it is not painful. The feeling may be described as:  Tingling or numbness.  Pins and needles.  Skin crawling.  Buzzing.  Limbs falling asleep.  Itching. Most people experience temporary (transient) paresthesia at some time in their lives. Paresthesia may occur when you breathe too quickly (hyperventilation). It can also occur without any apparent cause. Commonly, paresthesia occurs when pressure is placed on a nerve. The sensation quickly goes away after the pressure is removed. For some people, however, paresthesia is a long-lasting (chronic) condition that is caused by an underlying disorder. If you continue to have paresthesia, you may need further medical evaluation. HOME CARE INSTRUCTIONS Watch your condition for any changes. Taking the following actions may help to lessen any discomfort that you are feeling:  Avoid drinking alcohol.  Try acupuncture or massage to help relieve your symptoms.  Keep all follow-up visits as directed by your health care provider. This is important. SEEK MEDICAL CARE IF:  You continue to have episodes of paresthesia.  Your burning or prickling feeling gets worse when you walk.  You have pain, cramps, or dizziness.  You develop a rash. SEEK IMMEDIATE MEDICAL CARE IF:  You feel weak.  You have trouble walking or moving.  You have problems with speech, understanding, or vision.  You feel confused.  You cannot control your bladder or bowel movements.  You have  numbness after an injury.  You faint.   This information is not intended to replace advice given to you by your health care provider. Make sure you discuss any questions you have with your health care provider.   Document Released: 02/10/2002 Document Revised: 07/07/2014 Document Reviewed: 02/16/2014 Elsevier Interactive Patient Education [employer]2016 [employer]vier Inc.

## 2015-08-29 NOTE — ED Provider Notes (Signed)
CSN: [id number]     Arrival date & time 08/29/15  0107 History   First MD Initiated Contact with Patient 08/29/15 [phone]     Chief Complaint  Patient presents with  . Chest Pain  . Numbness    toe numbness      (Consider location/radiation/quality/duration/timing/severity/associated sxs/prior Treatment) The history is provided by the patient.     Patient with 5 month history symptoms of tachycardia, chest pain, loose stools, insomnia, arthralgia, fatigue recently diagnosed with chronic lyme disease p/w recurrent symptoms that began around 10pm.  States she felt that all of her extremities were suddenly very heavy, later developed pain in her left arm that radiated into her left upper chest and left upper back.  The left arm pain lasted 2-3 minutes and resolved.  She has also had SOB, abdominal bloating.  Her symptoms that developed overnight have somewhat improved.  She has recently started on a multi-drug regimen for the Lyme disease.  Has seen both cardiology and neurology and has a sleep study scheduled for tomorrow.    Past Medical History  Diagnosis Date  . Anxiety   . History of chicken pox   . Allergy   . Tachycardia   . Migraine   . Thyroid nodule    Past Surgical History  Procedure Laterality Date  . Tonsillectomy    . Stapedes surgery    . Appendectomy     Family History  Problem Relation Age of Onset  . Heart disease      father side of family  . Transient ischemic attack Father   . Hypertension Father   . Hyperlipidemia Father   . Heart Problems      father's side  . Cancer Maternal Grandmother     melanoma, startd as skin cancer on the back  . Hypertension Mother   . Hyperlipidemia Mother   . Heart disease Maternal Grandfather   . Diabetes Paternal Grandmother    Social History  Substance Use Topics  . Smoking status: Former Smoker    Quit date: 06/03/1990  . Smokeless tobacco: Never Used  . Alcohol Use: Yes   OB History    No data available      Review of Systems  All other systems reviewed and are negative.     Allergies  Review of patient's allergies indicates no known allergies.  Home Medications   Prior to Admission medications   Medication Sig Start Date End Date Taking? Authorizing Provider  Acetylcysteine (NAC PO) Take 1 capsule by mouth 2 (two) times daily.   Yes Historical Provider, MD  amoxicillin (AMOXIL) 500 MG capsule Take 1,500 mg by mouth 2 (two) times daily. 21 day therapy patient began on 08/26/15. 08/26/15  Yes Historical Provider, MD  ASHWAGANDHA PO Take 1 capsule by mouth 2 (two) times daily.   Yes Historical Provider, MD  Collagen Hydrolysate POWD 1 scoop by Does not apply route every morning.   Yes Historical Provider, MD  COLOSTRUM PO Take 4 tablets by mouth 2 (two) times daily.   Yes Historical Provider, MD  doxycycline (VIBRA-TABS) 100 MG tablet Take 100 mg by mouth 2 (two) times daily. 28 day therapy patient began therapy on 08/28/15 08/26/15  Yes Historical Provider, MD  fluconazole (DIFLUCAN) 200 MG tablet Take 200 mg by mouth daily. 5 day therapy course patient began on 08/28/15. 08/26/15  Yes Historical Provider, MD  ibuprofen (ADVIL,MOTRIN) 200 MG tablet Take 400 mg by mouth every 6 (six) hours as needed for mild  pain.   Yes Historical Provider, MD  Iodine, Kelp, (KELP PO) Take 1 capsule by mouth daily with lunch.   Yes Historical Provider, MD  LORazepam (ATIVAN) 1 MG tablet Take 1 tablet (1 mg total) by mouth 2 (two) times daily as needed for anxiety. 07/03/15  Yes Danelle BerryLeisa Tapia, PA-C  metoprolol succinate (TOPROL-XL) 25 MG 24 hr tablet Take 25 mg by mouth daily. *May take additional dose for palpitations* 08/24/15  Yes Historical Provider, MD  Nutritional Supplements (GRAPESEED EXTRACT PO) Take 5 drops by mouth 2 (two) times daily. In 8 ounces of water.   Yes Historical Provider, MD  OVER THE COUNTER MEDICATION Take 1 capsule by mouth 2 (two) times daily. r-alpha lipoic acid   Yes Historical Provider, MD   probenecid (BENEMID) 500 MG tablet Take 500 mg by mouth as directed. Daily for 21 days only started on 08/28/15 08/26/15  Yes Historical Provider, MD  Probiotic Product (PROBIOTIC PO) Take 1 capsule by mouth daily.   Yes Historical Provider, MD  progesterone (PROMETRIUM) 100 MG capsule Take 200 mg by mouth at bedtime.  05/22/15  Yes Historical Provider, MD  SELENIUM PO Take 1 capsule by mouth daily with lunch.   Yes Historical Provider, MD  SUMAtriptan (IMITREX) 50 MG tablet Take 1 tablet (50 mg total) by mouth every 2 (two) hours as needed for migraine. May repeat in 2 hours if headache persists or recurs. 06/03/15  Yes Sheliah HatchKatherine E Tabori, MD  Thyroid (NATURE-THROID) 48.75 MG TABS Take 1 tablet by mouth daily.   Yes Historical Provider, MD  TRANSDERMAL BASE EX Apply 1 application topically daily. Transdermal (topical) Magnesium   Yes Historical Provider, MD  TURMERIC PO Take 1 capsule by mouth 2 (two) times daily.   Yes Historical Provider, MD  metoprolol tartrate (LOPRESSOR) 25 MG tablet Take 0.5 tablets (12.5 mg total) by mouth 2 (two) times daily. Ok to take an extra 1/2 or 1 tab as needed for palpitations Patient not taking: Reported on 08/29/2015 05/18/15   Joline Salthonda G Barrett, PA-C  Naltrexone HCl POWD Take 4.5 mg by mouth daily. immune modulators    Historical Provider, MD  propranolol (INDERAL) 10 MG tablet Take 1 tablet (10 mg total) by mouth 2 (two) times daily as needed (rapid heartbeat). Patient not taking: Reported on 08/23/2015 08/19/15   Dione Boozeavid Glick, MD   BP 111/83 mmHg  Pulse 70  Temp(Src) 98.1 F (36.7 C) (Oral)  Ht 5' 3.5" (1.613 m)  Wt 69.4 kg  BMI 26.67 kg/m2  SpO2 99%  LMP 08/08/2015 Physical Exam  Constitutional: She appears well-developed and well-nourished. No distress.  HENT:  Head: Normocephalic and atraumatic.  Neck: Neck supple.  Cardiovascular: Normal rate and regular rhythm.   Pulmonary/Chest: Effort normal and breath sounds normal. No respiratory distress. She has  no wheezes. She has no rales. She exhibits tenderness.  Abdominal: Soft. She exhibits no distension. There is tenderness in the suprapubic area. There is no rebound and no guarding.  Neurological: She is alert.  CN II-XII intact, EOMs intact, no pronator drift, grip strengths equal bilaterally; strength 5/5 in all extremities, sensation intact in all extremities; finger to nose, heel to shin, rapid alternating movements normal; gait is normal.     Skin: She is not diaphoretic.  Nursing note and vitals reviewed.   ED Course  Procedures (including critical care time) Labs Review Labs Reviewed  BASIC METABOLIC PANEL - Abnormal; Notable for the following:    Anion gap 3 (*)  All other components within normal limits  CBC  URINALYSIS, ROUTINE W REFLEX MICROSCOPIC (NOT AT [facility])  Rosezena Sensor, ED  POC URINE PREG, ED    Imaging Review Dg Chest 2 View  08/29/2015  CLINICAL DATA:  Acute onset of left-sided chest pain and dizziness. Irregular sinus tachycardia. Initial encounter. EXAM: CHEST  2 VIEW COMPARISON:  Chest radiograph performed 08/19/2015 FINDINGS: The lungs are well-aerated and clear. There is no evidence of focal opacification, pleural effusion or pneumothorax. The heart is normal in size; the mediastinal contour is within normal limits. No acute osseous abnormalities are seen. IMPRESSION: No acute cardiopulmonary process seen. Electronically Signed   By: Roanna Raider M.D.   On: 08/29/2015 02:11   I have personally reviewed and evaluated these images and lab results as part of my medical decision-making.   EKG Interpretation   Date/Time:  Sunday August 29 2015 07:43:45 EDT Ventricular Rate:  58 PR Interval:    QRS Duration: 97 QT Interval:  426 QTC Calculation: 419 R Axis:   67 Text Interpretation:  Sinus rhythm artifact noted ECG OTHERWISE WITHIN  NORMAL LIMITS Confirmed by Bebe Shaggy  MD, Dorinda Hill ([id number]) on 08/29/2015  7:49:09 AM      MDM   Final diagnoses:   Paresthesia  Left arm pain  Tachycardia, paroxysmal (HCC)    Afebrile, nontoxic patient with multiple symptoms that have been intermittently occuring x 5 months, has been to ED multiple times and several specialists, recently started treatment for chronic Lyme dz. The symptoms tonight worried her but are similar to other episodes she has had.  Is following with specialists and has ID follow up being scheduled.  Pt feeling better prior to d/c. Workup reassuring.  D/C home with follow up as previously planned.  Discussed result, findings, treatment, and follow up  with patient.  Pt given return precautions.  Pt verbalizes understanding and agrees with plan.         Trixie Dredge, PA-C 08/29/15 1006  Zadie Rhine, MD 08/29/15 1536

## 2015-08-30 ENCOUNTER — Ambulatory Visit (INDEPENDENT_AMBULATORY_CARE_PROVIDER_SITE_OTHER): Admitting: Neurology

## 2015-08-30 DIAGNOSIS — G4710 Hypersomnia, unspecified: Secondary | ICD-10-CM

## 2015-08-30 DIAGNOSIS — G4730 Sleep apnea, unspecified: Secondary | ICD-10-CM | POA: Diagnosis not present

## 2015-08-30 DIAGNOSIS — F513 Sleepwalking [somnambulism]: Secondary | ICD-10-CM

## 2015-08-30 DIAGNOSIS — I472 Ventricular tachycardia: Secondary | ICD-10-CM

## 2015-08-30 DIAGNOSIS — G4753 Recurrent isolated sleep paralysis: Secondary | ICD-10-CM

## 2015-08-30 DIAGNOSIS — R6889 Other general symptoms and signs: Secondary | ICD-10-CM

## 2015-08-30 DIAGNOSIS — I4729 Other ventricular tachycardia: Secondary | ICD-10-CM

## 2015-09-13 ENCOUNTER — Telehealth: Payer: Self-pay

## 2015-09-13 NOTE — Telephone Encounter (Signed)
I called pt to discuss HLA results (negative). Left a message asking that pt call me back.

## 2015-09-15 NOTE — Telephone Encounter (Signed)
I called pt again to discuss sleep study results and HLA results. No answer, left another message asking her to call me back.

## 2015-09-16 NOTE — Telephone Encounter (Signed)
I spoke to pt. I advised her that her HLA blood test for narcolepsy was negative-normal. Pt verbalized understanding.  I advised her that her sleep study revealed no reason or cause for the excessive daytime sleepiness. Dr. Brett Fairy may consider an MSLT and a follow up with Dr. Brett Fairy was recommended. Pt says that after her office visit with Dr. Brett Fairy she was diagnosed with lyme disease and the lyme disease "has gotten into my neurological system" which pt says can explain her sleepiness. I recommended a follow up with Dr. Brett Fairy to discuss this in further detail before deciding on a treatment plan. Pt is agreeable. Pt made an appt for 09/20/15 at 8:30. Pt verbalized understanding of results. Pt had no questions at this time but was encouraged to call back if questions arise.

## 2015-09-19 ENCOUNTER — Emergency Department (HOSPITAL_COMMUNITY)

## 2015-09-19 ENCOUNTER — Emergency Department (HOSPITAL_COMMUNITY)
Admission: EM | Admit: 2015-09-19 | Discharge: 2015-09-19 | Disposition: A | Discharge status: Home / Self Care | Attending: Emergency Medicine | Admitting: Emergency Medicine

## 2015-09-19 ENCOUNTER — Encounter (HOSPITAL_COMMUNITY): Payer: Self-pay | Admitting: Emergency Medicine

## 2015-09-19 DIAGNOSIS — R0789 Other chest pain: Secondary | ICD-10-CM | POA: Insufficient documentation

## 2015-09-19 DIAGNOSIS — Z87891 Personal history of nicotine dependence: Secondary | ICD-10-CM | POA: Diagnosis not present

## 2015-09-19 DIAGNOSIS — R079 Chest pain, unspecified: Secondary | ICD-10-CM

## 2015-09-19 LAB — MAGNESIUM: MAGNESIUM: 2.2 mg/dL (ref 1.7–2.4)

## 2015-09-19 LAB — CBC
HCT: 42.7 % (ref 36.0–46.0)
HEMOGLOBIN: 14.6 g/dL (ref 12.0–15.0)
MCH: 32.3 pg (ref 26.0–34.0)
MCHC: 34.2 g/dL (ref 30.0–36.0)
MCV: 94.5 fL (ref 78.0–100.0)
PLATELETS: 365 10*3/uL (ref 150–400)
RBC: 4.52 MIL/uL (ref 3.87–5.11)
RDW: 12.6 % (ref 11.5–15.5)
WBC: 5.6 10*3/uL (ref 4.0–10.5)

## 2015-09-19 LAB — BASIC METABOLIC PANEL
ANION GAP: 7 (ref 5–15)
BUN: 13 mg/dL (ref 6–20)
CHLORIDE: 107 mmol/L (ref 101–111)
CO2: 22 mmol/L (ref 22–32)
CREATININE: 0.69 mg/dL (ref 0.44–1.00)
Calcium: 10.3 mg/dL (ref 8.9–10.3)
GFR calc non Af Amer: >60 mL/min (ref >60)
Glucose, Bld: 89 mg/dL (ref 65–99)
Potassium: 4.4 mmol/L (ref 3.5–5.1)
SODIUM: 136 mmol/L (ref 135–145)

## 2015-09-19 LAB — I-STAT TROPONIN, ED
TROPONIN I, POC: 0 ng/mL (ref 0.00–0.08)
TROPONIN I, POC: 0 ng/mL (ref 0.00–0.08)

## 2015-09-19 MED ORDER — KETOROLAC TROMETHAMINE 30 MG/ML IJ SOLN
30.0000 mg | Freq: Once | INTRAMUSCULAR | Status: AC
  Administered 2015-09-19: 30 mg via INTRAVENOUS
  Filled 2015-09-19: qty 1

## 2015-09-19 MED ORDER — SODIUM CHLORIDE 0.9 % IV BOLUS (SEPSIS)
1000.0000 mL | Freq: Once | INTRAVENOUS | Status: AC
  Administered 2015-09-19: 1000 mL via INTRAVENOUS

## 2015-09-19 NOTE — ED Notes (Signed)
Patient transported to x-ray. ?

## 2015-09-19 NOTE — ED Notes (Signed)
While lab was drawing i-stat troponin, pt became "queasy" and nauseated. Given cup ice water.

## 2015-09-19 NOTE — ED Notes (Signed)
Repeat ekg in 10 mins done

## 2015-09-19 NOTE — ED Notes (Signed)
Patient verbalized understanding of discharge instructions and denies any further needs or questions at this time. VS stable. Patient ambulatory with steady gait, escorted to ED entrance in wheelchair.   

## 2015-09-19 NOTE — ED Notes (Signed)
Repeat ekg signed DR.BELFI

## 2015-09-19 NOTE — ED Provider Notes (Signed)
CSN: [id number]     Arrival date & time 09/19/15  [id number] History  By signing my name below, I, Pamela Short, attest that this documentation has been prepared under the direction and in the presence of [name], MD . Electronically Signed: Freida Busmaniana Short, Scribe. 09/19/2015. 3:51 AM.    Chief Complaint  Patient presents with  . Chest Pain  . Fatigue  . Palpitations   The history is provided by the patient. No language interpreter was used.     HPI Comments:  Pamela Short is a 45 y.o. female who presents to the Emergency Department complaining of stabbing left sided CP and tightness in her back that radiates around to her ribcage since last night (09/18/15). She was asleep at the time of onset. Pt states she has been in the ED multiple times for CP. She notes her pain today is similar to past episodes. Her pain has improved at this time but is still present. Pain is exacerbated with deep breaths. Pt reports associated nausea, fatigue, and palpiations. Pt also notes diagnosis of lyme's disease ~ 3 weeks ago. She was started on the appropriate medications including doxycycline and amoxicillin. Pt denies rhinorrhea but notes cough; states she was recently diagnosed with walking PNA. Pt takes progesterone but denies use of estrogen hormone replacements. She also denies recent long period of immobilization and recent surgery.  [place]ilty- Cardiologist  Past Medical History  Diagnosis Date  . Anxiety   . History of chicken pox   . Allergy   . Tachycardia   . Migraine   . Thyroid nodule    Past Surgical History  Procedure Laterality Date  . Tonsillectomy    . Stapedes surgery    . Appendectomy     Family History  Problem Relation Age of Onset  . Heart disease      father side of family  . Transient ischemic attack Father   . Hypertension Father   . Hyperlipidemia Father   . Heart Problems      father's side  . Cancer Maternal Grandmother     melanoma, startd as skin cancer on the back   . Hypertension Mother   . Hyperlipidemia Mother   . Heart disease Maternal Grandfather   . Diabetes Paternal Grandmother    Social History  Substance Use Topics  . Smoking status: Former Smoker    Quit date: 06/03/1990  . Smokeless tobacco: Never Used  . Alcohol Use: Yes   OB History    No data available     Review of Systems  10 systems reviewed and all are negative for acute change except as noted in the HPI.  Allergies  Review of patient's allergies indicates no known allergies.  Home Medications   Prior to Admission medications   Medication Sig Start Date End Date Taking? Authorizing Provider  Acetylcysteine (NAC PO) Take 1 capsule by mouth 2 (two) times daily.    Historical Provider, MD  amoxicillin (AMOXIL) 500 MG capsule Take 1,500 mg by mouth 2 (two) times daily. 21 day therapy patient began on 08/26/15. 08/26/15   Historical Provider, MD  ASHWAGANDHA PO Take 1 capsule by mouth 2 (two) times daily.    Historical Provider, MD  Collagen Hydrolysate POWD 1 scoop by Does not apply route every morning.    Historical Provider, MD  COLOSTRUM PO Take 4 tablets by mouth 2 (two) times daily.    Historical Provider, MD  doxycycline (VIBRA-TABS) 100 MG tablet Take 100 mg by mouth 2 (  two) times daily. 28 day therapy patient began therapy on 08/28/15 08/26/15   Historical Provider, MD  fluconazole (DIFLUCAN) 200 MG tablet Take 200 mg by mouth daily. 5 day therapy course patient began on 08/28/15. 08/26/15   Historical Provider, MD  ibuprofen (ADVIL,MOTRIN) 200 MG tablet Take 400 mg by mouth every 6 (six) hours as needed for mild pain.    Historical Provider, MD  Iodine, Kelp, (KELP PO) Take 1 capsule by mouth daily with lunch.    Historical Provider, MD  LORazepam (ATIVAN) 1 MG tablet Take 1 tablet (1 mg total) by mouth 2 (two) times daily as needed for anxiety. 07/03/15   Danelle Berry, PA-C  metoprolol succinate (TOPROL-XL) 25 MG 24 hr tablet Take 25 mg by mouth daily. *May take additional  dose for palpitations* 08/24/15   Historical Provider, MD  metoprolol tartrate (LOPRESSOR) 25 MG tablet Take 0.5 tablets (12.5 mg total) by mouth 2 (two) times daily. Ok to take an extra 1/2 or 1 tab as needed for palpitations Patient not taking: Reported on 08/29/2015 05/18/15   [name], PA-C  Naltrexone HCl POWD Take 4.5 mg by mouth daily. immune modulators    Historical Provider, MD  Nutritional Supplements (GRAPESEED EXTRACT PO) Take 5 drops by mouth 2 (two) times daily. In 8 ounces of water.    Historical Provider, MD  OVER THE COUNTER MEDICATION Take 1 capsule by mouth 2 (two) times daily. r-alpha lipoic acid    Historical Provider, MD  probenecid (BENEMID) 500 MG tablet Take 500 mg by mouth as directed. Daily for 21 days only started on 08/28/15 08/26/15   Historical Provider, MD  Probiotic Product (PROBIOTIC PO) Take 1 capsule by mouth daily.    Historical Provider, MD  progesterone (PROMETRIUM) 100 MG capsule Take 200 mg by mouth at bedtime.  05/22/15   Historical Provider, MD  propranolol (INDERAL) 10 MG tablet Take 1 tablet (10 mg total) by mouth 2 (two) times daily as needed (rapid heartbeat). Patient not taking: Reported on 08/23/2015 08/19/15   Dione Booze, MD  SELENIUM PO Take 1 capsule by mouth daily with lunch.    Historical Provider, MD  SUMAtriptan (IMITREX) 50 MG tablet Take 1 tablet (50 mg total) by mouth every 2 (two) hours as needed for migraine. May repeat in 2 hours if headache persists or recurs. 06/03/15   [name], MD  Thyroid (NATURE-THROID) 48.75 MG TABS Take 1 tablet by mouth daily.    Historical Provider, MD  TRANSDERMAL BASE EX Apply 1 application topically daily. Transdermal (topical) Magnesium    Historical Provider, MD  TURMERIC PO Take 1 capsule by mouth 2 (two) times daily.    Historical Provider, MD   BP 106/74 mmHg  Pulse 68  Temp(Src) 97.6 F (36.4 C) (Oral)  Resp 18  SpO2 99%  LMP 09/12/2015 (Exact Date) Physical Exam  Constitutional: She  is oriented to person, place, and time. She appears well-developed and well-nourished. No distress.  HENT:  Head: Normocephalic and atraumatic.  Nose: Nose normal.  Mouth/Throat: Oropharynx is clear and moist. No oropharyngeal exudate.  Eyes: Conjunctivae and EOM are normal. Pupils are equal, round, and reactive to light. No scleral icterus.  Neck: Normal range of motion. Neck supple. No JVD present. No tracheal deviation present. No thyromegaly present.  Cardiovascular: Normal rate, regular rhythm and normal heart sounds.  Exam reveals no gallop and no friction rub.   No murmur heard. Pulmonary/Chest: Effort normal and breath sounds normal. No respiratory  distress. She has no wheezes. She exhibits no tenderness.  Abdominal: Soft. Bowel sounds are normal. She exhibits no distension and no mass. There is no tenderness. There is no rebound and no guarding.  Musculoskeletal: Normal range of motion. She exhibits no edema or tenderness.  Lymphadenopathy:    She has no cervical adenopathy.  Neurological: She is alert and oriented to person, place, and time. No cranial nerve deficit. She exhibits normal muscle tone.  Skin: Skin is warm and dry. No rash noted. No erythema. No pallor.  Nursing note and vitals reviewed.   ED Course  Procedures  DIAGNOSTIC STUDIES:  Oxygen Saturation is 100% on RA, normal by my interpretation.    COORDINATION OF CARE:  3:48 AM Discussed treatment plan with pt at bedside and pt agreed to plan.  Labs Review Labs Reviewed  BASIC METABOLIC PANEL  CBC  MAGNESIUM  I-STAT TROPOININ, ED  Rosezena Sensor, ED    Imaging Review Dg Chest 2 View  09/19/2015  CLINICAL DATA:  Chest pain, shortness of breath, nausea, and headache. Recent diagnosis of Lyme disease. EXAM: CHEST  2 VIEW COMPARISON:  08/29/2015 FINDINGS: The heart size and mediastinal contours are within normal limits. Both lungs are clear. The visualized skeletal structures are unremarkable. IMPRESSION:  No active cardiopulmonary disease. Electronically Signed   By: Burman Nieves M.D.   On: 09/19/2015 04:40   I have personally reviewed and evaluated these images and lab results as part of my medical decision-making.   EKG Interpretation   Date/Time:  Sunday September 19 2015 03:13:53 EDT Ventricular Rate:  81 PR Interval:  146 QRS Duration: 88 QT Interval:  382 QTC Calculation: 443 R Axis:   89 Text Interpretation:  Normal sinus rhythm Right atrial enlargement  Borderline ECG No significant change since last tracing Confirmed by Erroll Luna [phone]) on 09/19/2015 3:31:33 AM      MDM   Final diagnoses:  None   Patient presents to the ED for chest pain.  This has been going on for a couple of months now.  She states that she had been doing well on her Lyme disease treatment and has not had CP for 3 weeks.  The same pain returned tonight however.  She was observed in the ED overnight and had 2 sets of troponins along with EKGs.  Both were unremarkable and unchanged.  CXR is negative as well. No reccurence of her symptoms in the ED.  Advised to follow up wit her doctors currently managing her at [facility].  She was given toradol and IVF.  She appears well and in NAD.  VS remain within her normal limits and she is safe for DC.    I personally performed the services described in this documentation, which was scribed in my presence. The recorded information has been reviewed and is accurate.     Tomasita Crumble, MD 09/19/15 [phone]

## 2015-09-19 NOTE — Discharge Instructions (Signed)
Nonspecific Chest Pain Pamela Short, your blood work, chest xray, and EKG today were normal.  See your regular doctor within 3 days for close follow up.  If any symptoms worsen, come back to the ED immediately. Thank you. It is often hard to find the cause of chest pain. There is always a chance that your pain could be related to something serious, such as a heart attack or a blood clot in your lungs. Chest pain can also be caused by conditions that are not life-threatening. If you have chest pain, it is very important to follow up with your doctor.  HOME CARE  If you were prescribed an antibiotic medicine, finish it all even if you start to feel better.  Avoid any activities that cause chest pain.  Do not use any tobacco products, including cigarettes, chewing tobacco, or electronic cigarettes. If you need help quitting, ask your doctor.  Do not drink alcohol.  Take medicines only as told by your doctor.  Keep all follow-up visits as told by your doctor. This is important. This includes any further testing if your chest pain does not go away.  Your doctor may tell you to keep your head raised (elevated) while you sleep.  Make lifestyle changes as told by your doctor. These may include:  Getting regular exercise. Ask your doctor to suggest some activities that are safe for you.  Eating a heart-healthy diet. Your doctor or a diet specialist (dietitian) can help you to learn healthy eating options.  Maintaining a healthy weight.  Managing diabetes, if necessary.  Reducing stress. GET HELP IF:  Your chest pain does not go away, even after treatment.  You have a rash with blisters on your chest.  You have a fever. GET HELP RIGHT AWAY IF:  Your chest pain is worse.  You have an increasing cough, or you cough up blood.  You have severe belly (abdominal) pain.  You feel extremely weak.  You pass out (faint).  You have chills.  You have sudden, unexplained chest  discomfort.  You have sudden, unexplained discomfort in your arms, back, neck, or jaw.  You have shortness of breath at any time.  You suddenly start to sweat, or your skin gets clammy.  You feel nauseous.  You vomit.  You suddenly feel light-headed or dizzy.  Your heart begins to beat quickly, or it feels like it is skipping beats. These symptoms may be an emergency. Do not wait to see if the symptoms will go away. Get medical help right away. Call your local emergency services (911 in the [place] Do not drive yourself to the hospital.   This information is not intended to replace advice given to you by your health care provider. Make sure you discuss any questions you have with your health care provider.   Document Released: 08/09/2007 Document Revised: 03/13/2014 Document Reviewed: 09/26/2013 Elsevier Interactive Patient Education [employer]2016 [employer]vier Inc.

## 2015-09-19 NOTE — ED Notes (Signed)
MD at bedside. 

## 2015-09-19 NOTE — ED Notes (Signed)
Patient arrives with complaint of left chest pain. States onset tonight at 2130 soon after going to bed. States pain is sharp and stabbing in nature. Also endorses lightheadedness and nausea. Hx of Lyme disease. States "it affects my heart" and "tonight my heart was racing". Explains that her fitbit showed HRs up the upper 80s while she was laying down.

## 2015-09-19 NOTE — ED Notes (Signed)
Dr Mora Bellmanni wants a repeat EKG done on pt in 10 mins

## 2015-09-20 ENCOUNTER — Ambulatory Visit (INDEPENDENT_AMBULATORY_CARE_PROVIDER_SITE_OTHER): Admitting: Neurology

## 2015-09-20 ENCOUNTER — Encounter: Payer: Self-pay | Admitting: Neurology

## 2015-09-20 VITALS — BP 100/68 | HR 72 | Resp 20 | Ht 63.5 in | Wt 145.0 lb

## 2015-09-20 DIAGNOSIS — A6920 Lyme disease, unspecified: Secondary | ICD-10-CM | POA: Insufficient documentation

## 2015-09-20 NOTE — Progress Notes (Signed)
SLEEP MED[facility]INE CLINIC   Provider:  Melvyn Novas, M D  Referring Provider: [name], MD Primary Care Physician:  Neena Rhymes, MD  Chief Complaint  Patient presents with  . Follow-up    pt recently diagnosed with lyme disease    HPI:  Pamela Short is a 45 y.o. female , seen here as a referral from Dr. Beverely Low for a sleep evaluation.   Patient presented to the hospital in February 2017 with palpitations, heart racing and a feeling of holding her breath at night.  Since that time she has been visiting the ED several times, with HR up to 167 bpm!  Yesterday, she was sunbathing and fell asleep, but woke with a startle. She seems to have sinus tachycardia and had an extensive workup with [facility]ila [facility]. She has another appointment with Dr. Leticia Clas at [facility] in [facility]. The episodes have become more and more frequent since February and she feels more anxious about them. She has noticed that she tried to intellectualize the high heart rate episodes, but cannot find a specific stressor or an anxietya at it origin. 4 years prior to today's presentation she did have a panic attack which led to an evaluation including pheochromocytoma workup, anxiety and panic disorder for finally diagnosed in after she was prescribed progesterone and some other hormones the spells seized for about 3 years. Now she feels anxious again not knowing why she has the new tachycardia spells. Her physical health has otherwise been unremarkable except for very soft stools which may be related to anxiety as well. She is afraid of going to sleep, afraid to stop breathing or to have a cardiac event.  Was started on Toprol . The tachycardic episodes were evaluated by Dr. Elvera Lennox for adrenalin output disorders and was found to be negative for pheochromocytoma.  She also  has some menstrual migraines. Has been prescribed triptans by PCP.  She is concerned that breath-holding spells  may be leading to the brady- tachycardia.   Sleep habits are as follows: The patient feels all the time fatigued and this has led her to have an early at bedtime usually at 8:39 PM. She has however difficulties to initiate sleep. Isolated nights she may have been up until midnight or later. Once she is entering sleep but she will only sleep for periods of 60-90 minutes and frequently wakes up. Each arousal poses another difficulty to go back to sleep. Once or twice at night arousals may be related to the feeling of her heart racing but many arousals are related to the feeling of a breath-holding spell or are not identified. There is no underlying pain disorder that would influence her sleep. She used to rise in the morning at 4.30 to gain time to exercise in the morning but now feels unable to muster the energy. At 6:30 she will try to rise and she feels neither restored or refreshed at that time. She may have averaged 5 hours of sleep on a good night. Nocturia ;none. diarrhea at night- 1-2 times.  Sleep medical history and family sleep history:  Patient has a childhood history of sleepwalking, she had night terrors, she also left the house trying to walk down the street. These episodes lasted until age 89 college, but she still a sleep talker. She also fights in her sleep, kick. 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  Social history: Married,  almost 24 ( from his side ) , almost 20, [age] and 45 years old. 6 biological children. 1 step daughter.    Interval history from 09/20/2015 Mrs. Ronda has undergone a polysomnography with parasomnia montage on 08/30/2015, There was no apnea noted - the patient does not have any periodic limb movements during her sleep and I could not find evidence of any organic sleep disorder during that recording. Her excessive daytime sleepiness remained unexplained except for her reported parasomnia activity.  I would  suggest to treat this condition of panic and anxiety attacks with anti depressant medication. Since her last visit , she has been diagnosed with Lyme disease, and has been placed on Antibiotics and multiple supplements. Was prescribed a sugar and dairy and gluten free diet.        Review of Systems: Out of a complete 14 system review, the patient complains of only the following symptoms, and all other reviewed systems are negative.  Epworth score  7 from  17 , Fatigue severity score  21 from 46  , depression score deferred. Fatigue, chest pain, palpitations, blurred vision, shortness of breath, coughing, feeling cold and cold at times with flushing, dizziness, anxiety daytime sleepiness.   Social History   Social History  . Marital Status: Married    Spouse Name: N/A  . Number of Children: N/A  . Years of Education: N/A   Occupational History  . Not on file.   Social History Main Topics  . Smoking status: Former Smoker    Quit date: 06/03/1990  . Smokeless tobacco: Never Used  . Alcohol Use: Yes  . Drug Use: No  . Sexual Activity: Yes   Other Topics Concern  . Not on file   Social History Narrative    Family History  Problem Relation Age of Onset  . Heart disease      father side of family  . Transient ischemic attack Father   . Hypertension Father   . Hyperlipidemia Father   . Heart Problems      father's side  . Cancer Maternal Grandmother     melanoma, startd as skin cancer on the back  . Hypertension Mother   . Hyperlipidemia Mother   . Heart disease Maternal Grandfather   . Diabetes Paternal Grandmother     Past Medical History  Diagnosis Date  . Anxiety   . History of chicken pox   . Allergy   . Tachycardia   . Migraine   . Thyroid nodule     Past Surgical History  Procedure Laterality Date  . Tonsillectomy    . Stapedes surgery    . Appendectomy      Current Outpatient Prescriptions  Medication Sig Dispense Refill  . Acetylcysteine (NAC  PO) Take 1 capsule by mouth 2 (two) times daily.    Marland Kitchen. amoxicillin (AMOXIL) 500 MG capsule Take 1,500 mg by mouth 2 (two) times daily. 21 day therapy patient began on 08/26/15.  0  . ASHWAGANDHA PO Take 1 capsule by mouth 2 (two) times daily.    . Collagen Hydrolysate POWD 1 scoop by Does not apply route every morning.    Marland Kitchen. COLOSTRUM PO Take 4 tablets by mouth 2 (two) times daily.    Marland Kitchen. doxycycline (VIBRA-TABS) 100 MG tablet Take 100 mg by mouth 2 (two) times daily. 28 day therapy patient began therapy on 08/28/15  0  . fluconazole (DIFLUCAN) 200 MG tablet Take 200 mg by mouth daily.   0  . ibuprofen (  ADVIL,MOTRIN) 200 MG tablet Take 400 mg by mouth every 6 (six) hours as needed for mild pain.    . Iodine, Kelp, (KELP PO) Take 1 capsule by mouth daily with lunch.    Marland Kitchen LORazepam (ATIVAN) 1 MG tablet Take 1 tablet (1 mg total) by mouth 2 (two) times daily as needed for anxiety. 10 tablet 0  . metoprolol tartrate (LOPRESSOR) 25 MG tablet Take 0.5 tablets (12.5 mg total) by mouth 2 (two) times daily. Ok to take an extra 1/2 or 1 tab as needed for palpitations 60 tablet 3  . Naltrexone HCl POWD Take 4.5 mg by mouth daily. immune modulators    . Nutritional Supplements (GRAPESEED EXTRACT PO) Take 5 drops by mouth 2 (two) times daily. In 8 ounces of water.    Marland Kitchen OVER THE COUNTER MEDICATION Take 1 capsule by mouth 2 (two) times daily. r-alpha lipoic acid    . probenecid (BENEMID) 500 MG tablet Take 500 mg by mouth as directed. Daily for 21 days only started on 08/28/15  0  . progesterone (PROMETRIUM) 100 MG capsule Take 200 mg by mouth at bedtime.     . SELENIUM PO Take 1 capsule by mouth daily with lunch.    . SUMAtriptan (IMITREX) 50 MG tablet Take 1 tablet (50 mg total) by mouth every 2 (two) hours as needed for migraine. May repeat in 2 hours if headache persists or recurs. 10 tablet 0  . Thyroid (NATURE-THROID) 48.75 MG TABS Take 1 tablet by mouth daily.    . TRANSDERMAL BASE EX Apply 1 application  topically daily. Transdermal (topical) Magnesium    . TURMERIC PO Take 1 capsule by mouth 2 (two) times daily.     No current facility-administered medications for this visit.    Allergies as of 09/20/2015  . (No Known Allergies)    Vitals: Wt 145 lb (65.772 kg)  LMP 09/12/2015 (Exact Date) Last Weight:  Wt Readings from Last 1 Encounters:  09/20/15 145 lb (65.772 kg)   ZOX:WRUE mass index is 25.28 kg/(m^2).     Last Height:   Ht Readings from Last 1 Encounters:  08/29/15 5' 3.5" (1.613 m)    Physical exam:  General: The patient is awake, alert and appears not in acute distress. The patient is well groomed. Head: Normocephalic, atraumatic. Neck is supple. Mallampati 1- uvula deviated to  the right. ,  neck circumference: 14. Nasal airflow patent , TMJ is  evident . Retrognathia is seen.  Cardiovascular:  Regular rate and rhythm, without  murmurs or carotid bruit, and without distended neck veins. Respiratory: Lungs are clear to auscultation. Skin:  Without evidence of edema, or rash Trunk: BMI is. The patient's posture is erect    Neurologic exam : The patient is awake and alert, oriented to place and time.   Memory subjective described as intact.  Attention span & concentration ability appears normal.  Speech is fluent,  without dysarthria, dysphonia or aphasia.  Mood and affect are appropriate.   The patient was advised of the nature of the diagnosed sleep disorder , the treatment options and risks for general a health and wellness arising from not treating the condition.  I spent more than 25 minutes of face to face time with the patient. Greater than 50% of time was spent in counseling and coordination of care. We have discussed the diagnosis and differential and I answered the patient's questions.     Assessment:  After physical and neurologic examination, review of laboratory  studies,  Personal review of imaging studies, reports of other /same  Imaging studies ,   Results of polysomnography/ neurophysiology testing and pre-existing records as far as provided in visit., my assessment is   1) Mrs. Parrillo has a history of sleep walking possible night terrors, she does not describe REM sleep behavior except for sleep talking. Her higher risk for having tachycardic events can be related to abnormal sleep and dream dysfunction. I will not order a full parasomnia montage but one to have good audio and video recording of the sleep study.  2)  diagnosed at [facility] with lyme.   Plan:  Treatment plan and additional workup :   Prn,  if symptoms don't improve, follow with ID Dr. Ninetta Lights.    Porfirio Mylar Kelson Queenan MD  09/20/2015   CC: [name], Md [address] [place], [place] [address]

## 2015-10-05 ENCOUNTER — Telehealth: Payer: Self-pay | Admitting: Internal Medicine

## 2015-10-05 ENCOUNTER — Ambulatory Visit: Admitting: Internal Medicine

## 2015-10-05 NOTE — Telephone Encounter (Signed)
Thanks for referral. Unfortunately, patient did not show for scheduled appointment.  Case reviewed, labs and history, including from [facility] record reviewed, not c/w Lyme disease from information provided.   Please resend referral if needed or contact me for questions. Thanks Staci Righter, MD

## 2015-10-06 NOTE — Telephone Encounter (Signed)
Thank you for your time and attention.  I will follow up w/ her as to why she missed her appt.  Thanks again!  -Jae Dire

## 2015-10-13 ENCOUNTER — Encounter (HOSPITAL_COMMUNITY): Payer: Self-pay | Admitting: Emergency Medicine

## 2015-10-13 ENCOUNTER — Emergency Department (HOSPITAL_COMMUNITY)

## 2015-10-13 ENCOUNTER — Emergency Department (HOSPITAL_COMMUNITY)
Admission: EM | Admit: 2015-10-13 | Discharge: 2015-10-14 | Disposition: A | Discharge status: Home / Self Care | Attending: Emergency Medicine | Admitting: Emergency Medicine

## 2015-10-13 DIAGNOSIS — R110 Nausea: Secondary | ICD-10-CM | POA: Insufficient documentation

## 2015-10-13 DIAGNOSIS — R0602 Shortness of breath: Secondary | ICD-10-CM | POA: Diagnosis not present

## 2015-10-13 DIAGNOSIS — Z79899 Other long term (current) drug therapy: Secondary | ICD-10-CM | POA: Diagnosis not present

## 2015-10-13 DIAGNOSIS — R197 Diarrhea, unspecified: Secondary | ICD-10-CM | POA: Diagnosis not present

## 2015-10-13 DIAGNOSIS — I10 Essential (primary) hypertension: Secondary | ICD-10-CM | POA: Insufficient documentation

## 2015-10-13 DIAGNOSIS — Z792 Long term (current) use of antibiotics: Secondary | ICD-10-CM | POA: Diagnosis not present

## 2015-10-13 DIAGNOSIS — Z87891 Personal history of nicotine dependence: Secondary | ICD-10-CM | POA: Diagnosis not present

## 2015-10-13 DIAGNOSIS — R0789 Other chest pain: Secondary | ICD-10-CM | POA: Diagnosis present

## 2015-10-13 DIAGNOSIS — R079 Chest pain, unspecified: Secondary | ICD-10-CM

## 2015-10-13 HISTORY — DX: Lyme disease, unspecified: A69.20

## 2015-10-13 NOTE — ED Provider Notes (Signed)
[facility]-EMERGENCY DEPT Provider Note   CSN: [id number] Arrival date & time: 10/13/15  2301  First Provider Contact:   First MD Initiated Contact with Patient 10/13/15 2359     By signing my name below, I, Freida Busman, attest that this documentation has been prepared under the direction and in the presence of Alvira Monday, MD . Electronically Signed: Freida Busman, Scribe. 10/14/2015. 12:11 AM.   History   Chief Complaint Chief Complaint  Patient presents with  . Chest Pain  . Shortness of Breath     The history is provided by the patient. No language interpreter was used.    HPI Comments:  Pamela Short is a 45 y.o. female who presents to the Emergency Department complaining of SOB which began ~9PM today. She states she was laying down in bed when her symptoms began. She also notes left sided CP and pain under her left arm that radiates down the extremity and into her back and neck. She describes this pain as a tightness with a throbbing sensation. She reports associated intermittent lightheadedness throughout the day, palpitations, nausea, and diarrhea (3 episodes today).  Pt states she has been having heart issues and palpitations since February 2017.  She notes her CP today is similar to past episodes of CP. Pt notes she was diagnosed with Lyme's Disease in June 2017 and has been on antibiotics. She denies leg pain swelling, use of Estrogen, and recent long periods of immobilization. No alleviating factors noted.    Past Medical History:  Diagnosis Date  . Allergy   . Anxiety   . History of chicken pox   . Lyme disease 08/2015  . Migraine   . Tachycardia   . Thyroid nodule     Patient Active Problem List   Diagnosis Date Noted  . Lyme disease 09/20/2015  . Anxiety state 06/06/2015  . Migraine 06/03/2015  . History of adrenal insufficiency 05/25/2015  . Inappropriate sinus tachycardia (HCC) 05/17/2015  . Essential hypertension   . SOB (shortness of breath)   .  Intermittent palpitations 05/05/2015  . Poor sleep pattern 05/05/2015    Past Surgical History:  Procedure Laterality Date  . APPENDECTOMY    . STAPEDES SURGERY    . TONSILLECTOMY      OB History    No data available       Home Medications    Prior to Admission medications   Medication Sig Start Date End Date Taking? Authorizing Provider  Acetylcysteine (NAC PO) Take 1 capsule by mouth 2 (two) times daily.    Historical Provider, MD  amoxicillin (AMOXIL) 500 MG capsule Take 1,500 mg by mouth 2 (two) times daily. 21 day therapy patient began on 08/26/15. 08/26/15   Historical Provider, MD  ASHWAGANDHA PO Take 1 capsule by mouth 2 (two) times daily.    Historical Provider, MD  Collagen Hydrolysate POWD 1 scoop by Does not apply route every morning.    Historical Provider, MD  COLOSTRUM PO Take 4 tablets by mouth 2 (two) times daily.    Historical Provider, MD  doxycycline (VIBRA-TABS) 100 MG tablet Take 100 mg by mouth 2 (two) times daily. 28 day therapy patient began therapy on 08/28/15 08/26/15   Historical Provider, MD  fluconazole (DIFLUCAN) 200 MG tablet Take 200 mg by mouth daily.  08/26/15   Historical Provider, MD  ibuprofen (ADVIL,MOTRIN) 200 MG tablet Take 400 mg by mouth every 6 (six) hours as needed for mild pain.    Historical Provider, MD  Iodine, Kelp, (KELP PO) Take 1 capsule by mouth daily with lunch.    Historical Provider, MD  LORazepam (ATIVAN) 1 MG tablet Take 1 tablet (1 mg total) by mouth 2 (two) times daily as needed for anxiety. 07/03/15   Danelle Berry, PA-C  metoprolol tartrate (LOPRESSOR) 25 MG tablet Take 0.5 tablets (12.5 mg total) by mouth 2 (two) times daily. Ok to take an extra 1/2 or 1 tab as needed for palpitations 05/18/15   [name], PA-C  Naltrexone HCl POWD Take 4.5 mg by mouth daily. immune modulators    Historical Provider, MD  Nutritional Supplements (GRAPESEED EXTRACT PO) Take 5 drops by mouth 2 (two) times daily. In 8 ounces of water.     Historical Provider, MD  OVER THE COUNTER MEDICATION Take 1 capsule by mouth 2 (two) times daily. r-alpha lipoic acid    Historical Provider, MD  probenecid (BENEMID) 500 MG tablet Take 500 mg by mouth as directed. Daily for 21 days only started on 08/28/15 08/26/15   Historical Provider, MD  progesterone (PROMETRIUM) 100 MG capsule Take 200 mg by mouth at bedtime.  05/22/15   Historical Provider, MD  SELENIUM PO Take 1 capsule by mouth daily with lunch.    Historical Provider, MD  SUMAtriptan (IMITREX) 50 MG tablet Take 1 tablet (50 mg total) by mouth every 2 (two) hours as needed for migraine. May repeat in 2 hours if headache persists or recurs. 06/03/15   [name], MD  Thyroid (NATURE-THROID) 48.75 MG TABS Take 1 tablet by mouth daily.    Historical Provider, MD  TRANSDERMAL BASE EX Apply 1 application topically daily. Transdermal (topical) Magnesium    Historical Provider, MD  TURMERIC PO Take 1 capsule by mouth 2 (two) times daily.    Historical Provider, MD    Family History Family History  Problem Relation Age of Onset  . Transient ischemic attack Father   . Hypertension Father   . Hyperlipidemia Father   . Cancer Maternal Grandmother     melanoma, startd as skin cancer on the back  . Hypertension Mother   . Hyperlipidemia Mother   . Heart disease Maternal Grandfather   . Diabetes Paternal Grandmother   . Heart disease      father side of family  . Heart Problems      father's side    Social History Social History  Substance Use Topics  . Smoking status: Former Smoker    Quit date: 06/03/1990  . Smokeless tobacco: Never Used  . Alcohol use No     Allergies   Review of patient's allergies indicates no known allergies.   Review of Systems Review of Systems  Constitutional: Negative for chills and fever.  Respiratory: Positive for shortness of breath.   Cardiovascular: Positive for chest pain and palpitations. Negative for leg swelling.  Gastrointestinal:  Positive for diarrhea and nausea. Negative for vomiting.  Neurological: Positive for light-headedness. Negative for syncope.     Physical Exam Updated Vital Signs BP 122/78 (BP Location: Right Arm)   Pulse 62   Temp 98.4 F (36.9 C) (Oral)   Resp 16   Ht  (1.6 m)   Wt 143 lb (64.9 kg)   LMP 10/12/2015   SpO2 100%   BMI 25.33 kg/m   Physical Exam  Constitutional: She is oriented to person, place, and time. She appears well-developed and well-nourished. No distress.  HENT:  Head: Normocephalic and atraumatic.  Eyes: Conjunctivae are normal.  Cardiovascular:  Normal rate and regular rhythm.   Pulmonary/Chest: Effort normal and breath sounds normal. No respiratory distress. She exhibits tenderness.  Abdominal: She exhibits no distension.  Neurological: She is alert and oriented to person, place, and time.  Skin: Skin is warm and dry.  Psychiatric: She has a normal mood and affect.  Nursing note and vitals reviewed.    ED Treatments / Results  DIAGNOSTIC STUDIES:  Oxygen Saturation is 99% on RA, normal by my interpretation.    COORDINATION OF CARE:  12:07 AM Discussed treatment plan with pt at bedside and pt agreed to plan.  Labs (all labs ordered are listed, but only abnormal results are displayed) Labs Reviewed  BASIC METABOLIC PANEL - Abnormal; Notable for the following:       Result Value   Anion gap 4 (*)    All other components within normal limits  CBC  I-STAT TROPOININ, ED  I-STAT BETA HCG BLOOD, ED ([facility], [facility], [facility] ONLY)  I-STAT TROPOININ, ED    EKG  EKG Interpretation  Date/Time:  Thursday October 14 2015 03:04:47 EDT Ventricular Rate:  67 PR Interval:    QRS Duration: 98 QT Interval:  424 QTC Calculation: 448 R Axis:   73 Text Interpretation:  Sinus rhythm Low voltage, precordial leads More significant PR depression than previous ECGs in scattered leads Confirmed by [facility] MD, Aubriauna Riner ([id number]) on 10/14/2015 4:20:17 AM Also confirmed by [facility]  MD, Anfernee Peschke ([id number]), editor [place] CT, Jola BabinskiMarilyn [phone]  on 10/14/2015 7:32:36 AM       Radiology Dg Chest 2 View  Result Date: 10/14/2015 CLINICAL DATA:  Initial evaluation for acute left-sided chest pain radiating down left arm, shortness of breath. EXAM: CHEST  2 VIEW COMPARISON:  Prior radiograph from 09/19/2015. FINDINGS: The cardiac and mediastinal silhouettes are stable in size and contour, and remain within normal limits. The lungs are normally inflated. No airspace consolidation, pleural effusion, or pulmonary edema is identified. There is no pneumothorax. No acute osseous abnormality identified. IMPRESSION: No active cardiopulmonary disease. Electronically Signed   By: Rise MuBenjamin  McClintock M.D.   On: 10/14/2015 00:24    Procedures Procedures (including critical care time)  Medications Ordered in ED Medications  ibuprofen (ADVIL,MOTRIN) tablet 400 mg (400 mg Oral Given 10/14/15 0054)  acetaminophen (TYLENOL) tablet 500 mg (500 mg Oral Given 10/14/15 0054)  gi cocktail (Maalox,Lidocaine,Donnatal) (30 mLs Oral Given 10/14/15 0249)     Initial Impression / Assessment and Plan / ED Course  I have reviewed the triage vital signs and the nursing notes.  Pertinent labs & imaging results that were available during my care of the patient were reviewed by me and considered in my medical decision making (see chart for details).  Clinical Course   45 year old female with a history of inappropriate sinus tachycardia, anxiety, migraine, diagnosis of Lyme disease in July 2017, prior visits to the emergency department with chest pain, and [facility], presents with concern for chest tightness. Patient has had prior workup for pulmonary embolus, including a CT PE study in June, is perc negative and low risk [place], and have low suspicion for pulmonary embolus. EKG shows no signs of pericarditis or other acute changes. Chest x-ray shows no signs of  pneumonia, no pneumothorax, no signs of congestive heart failure. Initial troponin is negative. Patient is low risk HEAR score.  Second troponin negative and ECG unchanged.  Some PR depression in leads, similar to prior, pt without positional pain, overall suspect benign  early repol over pericarditis.  Delta troponins negative.  Recommend continued outpt follow up with PCP or cardiologist within one week.   Final Clinical Impressions(s) / ED Diagnoses   Final diagnoses:  Chest pain, unspecified chest pain type    New Prescriptions Discharge Medication List as of 10/14/2015  4:37 AM     I personally performed the services described in this documentation, which was scribed in my presence. The recorded information has been reviewed and is accurate.    Alvira Monday, MD 10/14/15 1626

## 2015-10-13 NOTE — ED Triage Notes (Signed)
Pt comes to ED today via POV with chest pain since today with last episode approximately two months ago.  Pt states that she has left sided chest pain with radiation to left axilla, left shoulder and left posterior neck.  Pt has some nausea with back pain and dizziness, SOB.

## 2015-10-14 LAB — BASIC METABOLIC PANEL
ANION GAP: 4 — AB (ref 5–15)
BUN: 11 mg/dL (ref 6–20)
CALCIUM: 9.8 mg/dL (ref 8.9–10.3)
CO2: 24 mmol/L (ref 22–32)
Chloride: 109 mmol/L (ref 101–111)
Creatinine, Ser: 0.91 mg/dL (ref 0.44–1.00)
GFR calc Af Amer: >60 mL/min (ref >60)
Glucose, Bld: 91 mg/dL (ref 65–99)
Potassium: 4.4 mmol/L (ref 3.5–5.1)
Sodium: 137 mmol/L (ref 135–145)

## 2015-10-14 LAB — I-STAT TROPONIN, ED
TROPONIN I, POC: 0 ng/mL (ref 0.00–0.08)
TROPONIN I, POC: 0 ng/mL (ref 0.00–0.08)

## 2015-10-14 LAB — I-STAT BETA HCG BLOOD, ED (MC, WL, AP ONLY): I-stat hCG, quantitative: <5 m[IU]/mL (ref <5)

## 2015-10-14 LAB — CBC
HCT: 39.3 % (ref 36.0–46.0)
HEMOGLOBIN: 13.3 g/dL (ref 12.0–15.0)
MCH: 32.3 pg (ref 26.0–34.0)
MCHC: 33.8 g/dL (ref 30.0–36.0)
MCV: 95.4 fL (ref 78.0–100.0)
Platelets: 294 10*3/uL (ref 150–400)
RBC: 4.12 MIL/uL (ref 3.87–5.11)
RDW: 13.1 % (ref 11.5–15.5)
WBC: 5.8 10*3/uL (ref 4.0–10.5)

## 2015-10-14 MED ORDER — ACETAMINOPHEN 500 MG PO TABS
500.0000 mg | ORAL_TABLET | Freq: Once | ORAL | Status: AC
  Administered 2015-10-14: 500 mg via ORAL
  Filled 2015-10-14: qty 1

## 2015-10-14 MED ORDER — IBUPROFEN 200 MG PO TABS
400.0000 mg | ORAL_TABLET | Freq: Once | ORAL | Status: AC
  Administered 2015-10-14: 400 mg via ORAL
  Filled 2015-10-14: qty 2

## 2015-10-14 MED ORDER — GI COCKTAIL ~~LOC~~
30.0000 mL | Freq: Once | ORAL | Status: AC
  Administered 2015-10-14: 30 mL via ORAL
  Filled 2015-10-14: qty 30

## 2015-10-14 NOTE — ED Notes (Signed)
Pt ambulatory and independent at discharge.  Verbalized understanding of discharge instructions 

## 2015-12-27 ENCOUNTER — Encounter: Payer: Self-pay | Admitting: Neurology

## 2017-05-29 IMAGING — DX DG CHEST 2V
2 series · 2 of 2 positions shown · non-contrast
Comparison: 05/01/2015

CLINICAL DATA: Heart racing.

EXAM:
CHEST  2 VIEW

[w chest pa]
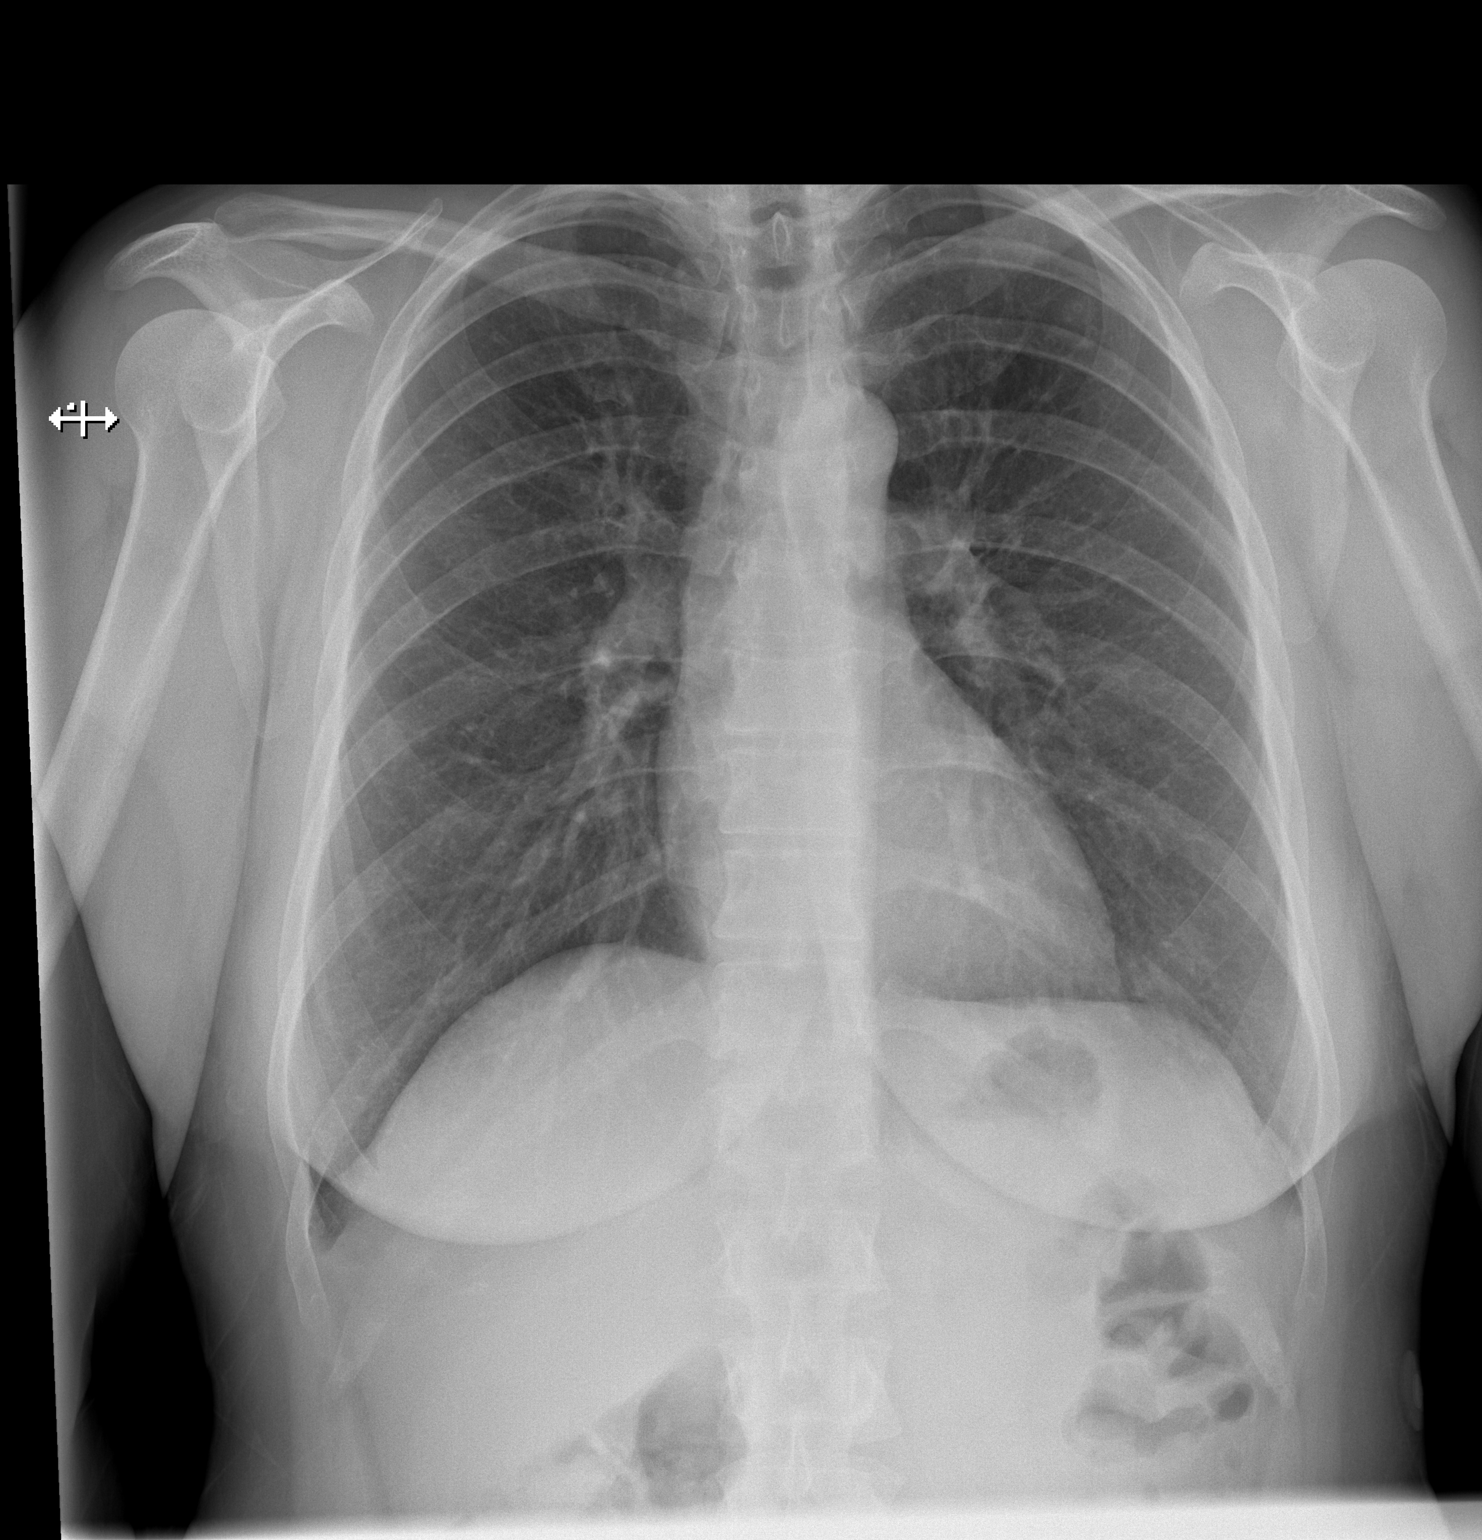

[w chest lat]
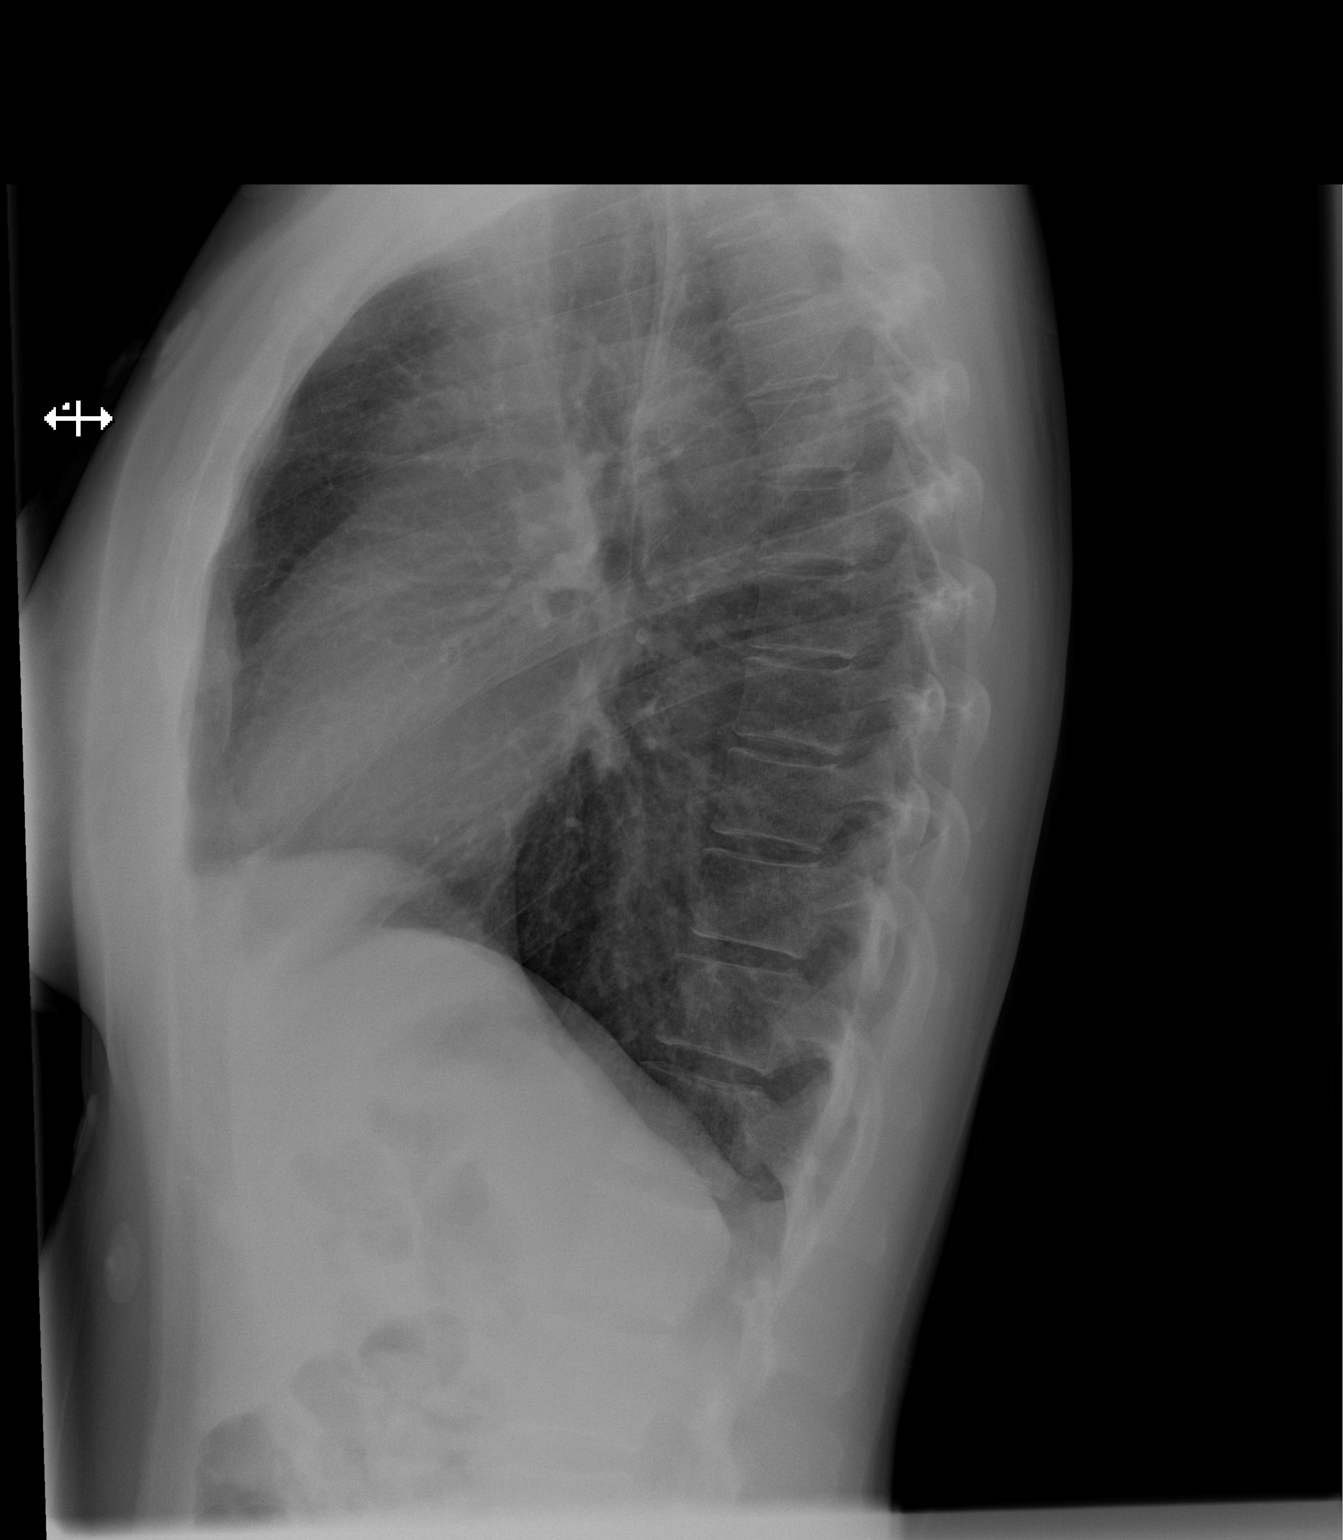

[2 of 2 positions shown; findings below may reference images not displayed]

FINDINGS: The heart size and mediastinal contours are within normal limits.
Both lungs are clear. The visualized skeletal structures are
unremarkable.
IMPRESSION: No active cardiopulmonary disease.

## 2017-07-16 IMAGING — CR DG CHEST 2V
2 series · 2 of 2 positions shown · non-contrast
Comparison: 06/16/2015

CLINICAL DATA: Sudden onset of chest pain, nausea and shortness of
the breath

EXAM:
CHEST  2 VIEW

[chest pa]
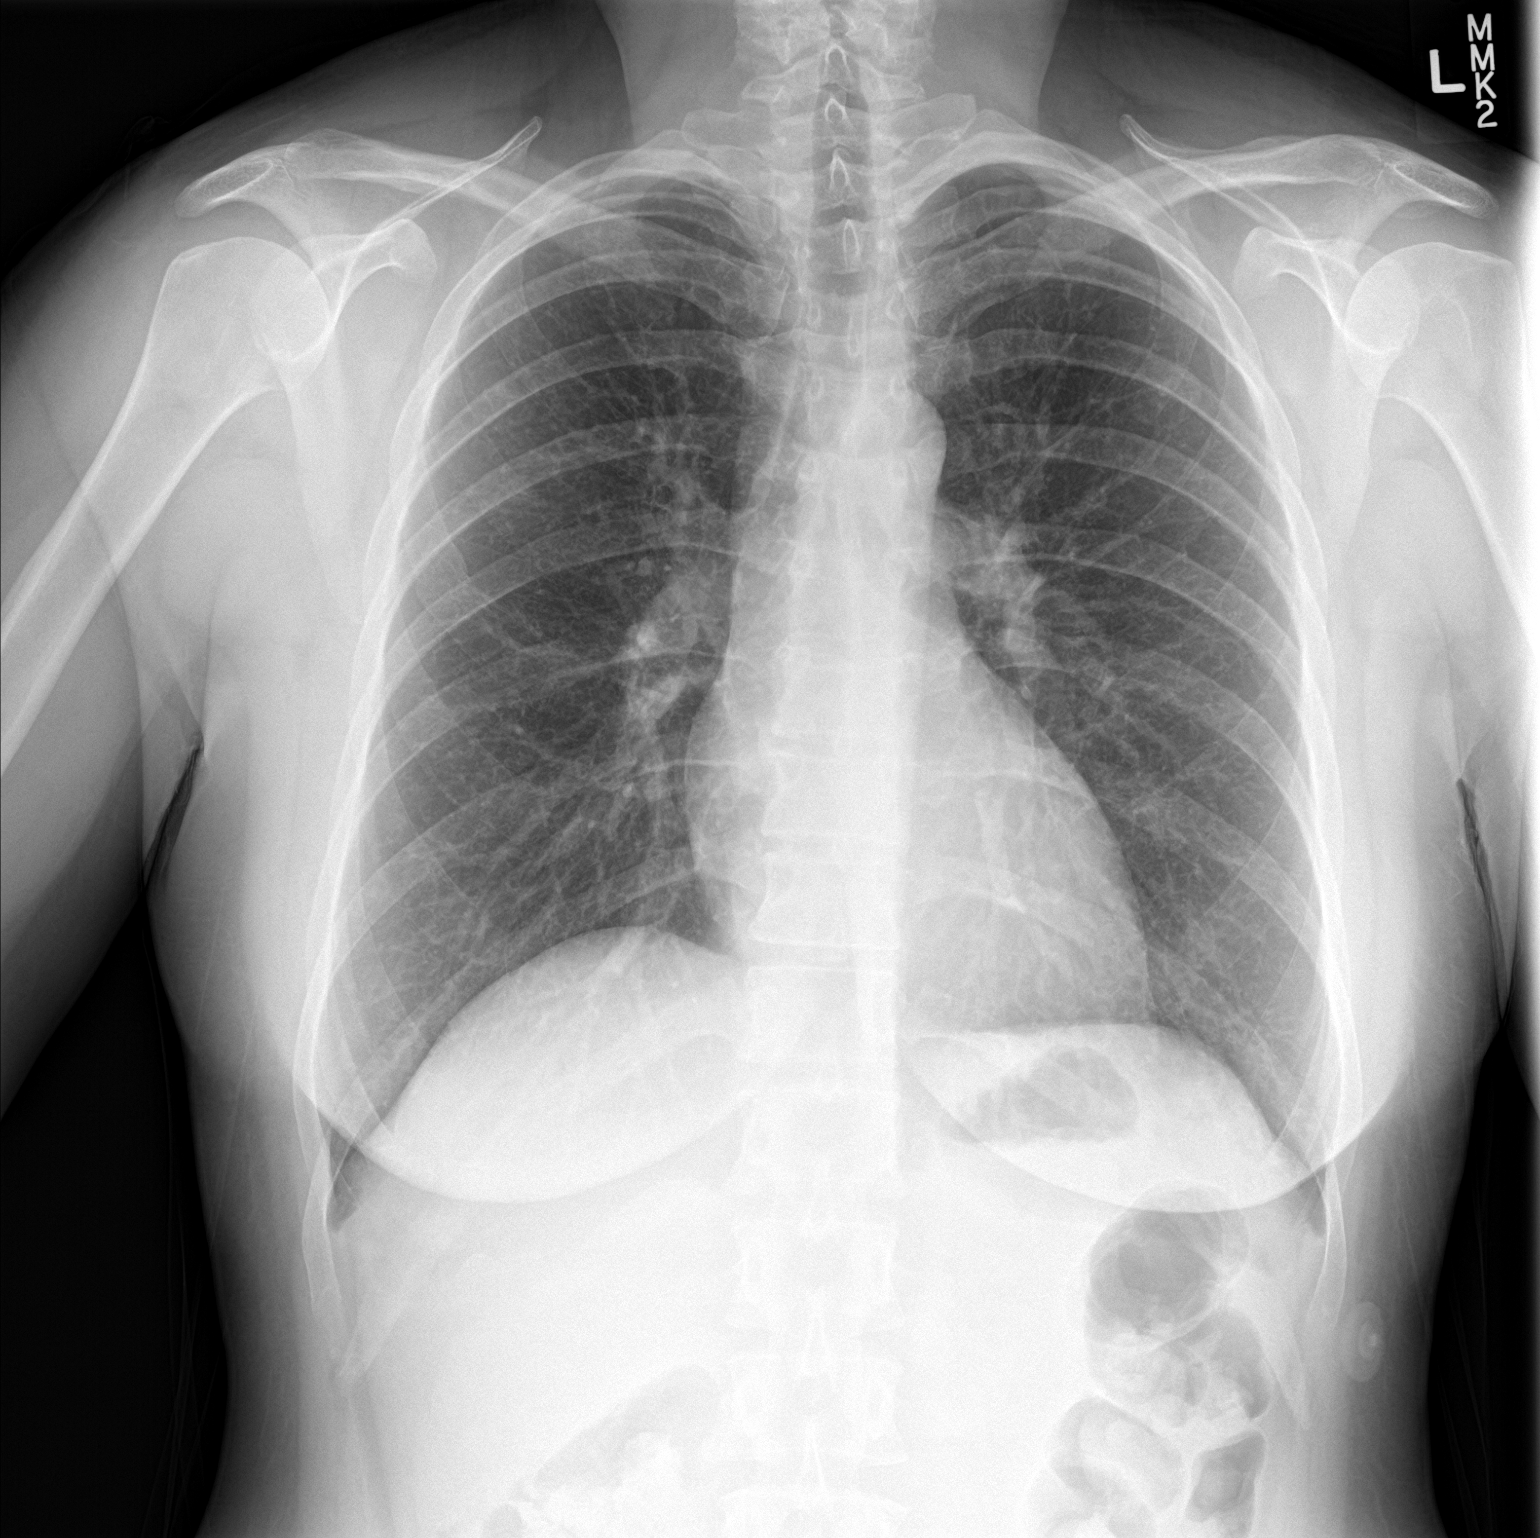

[chest lat]
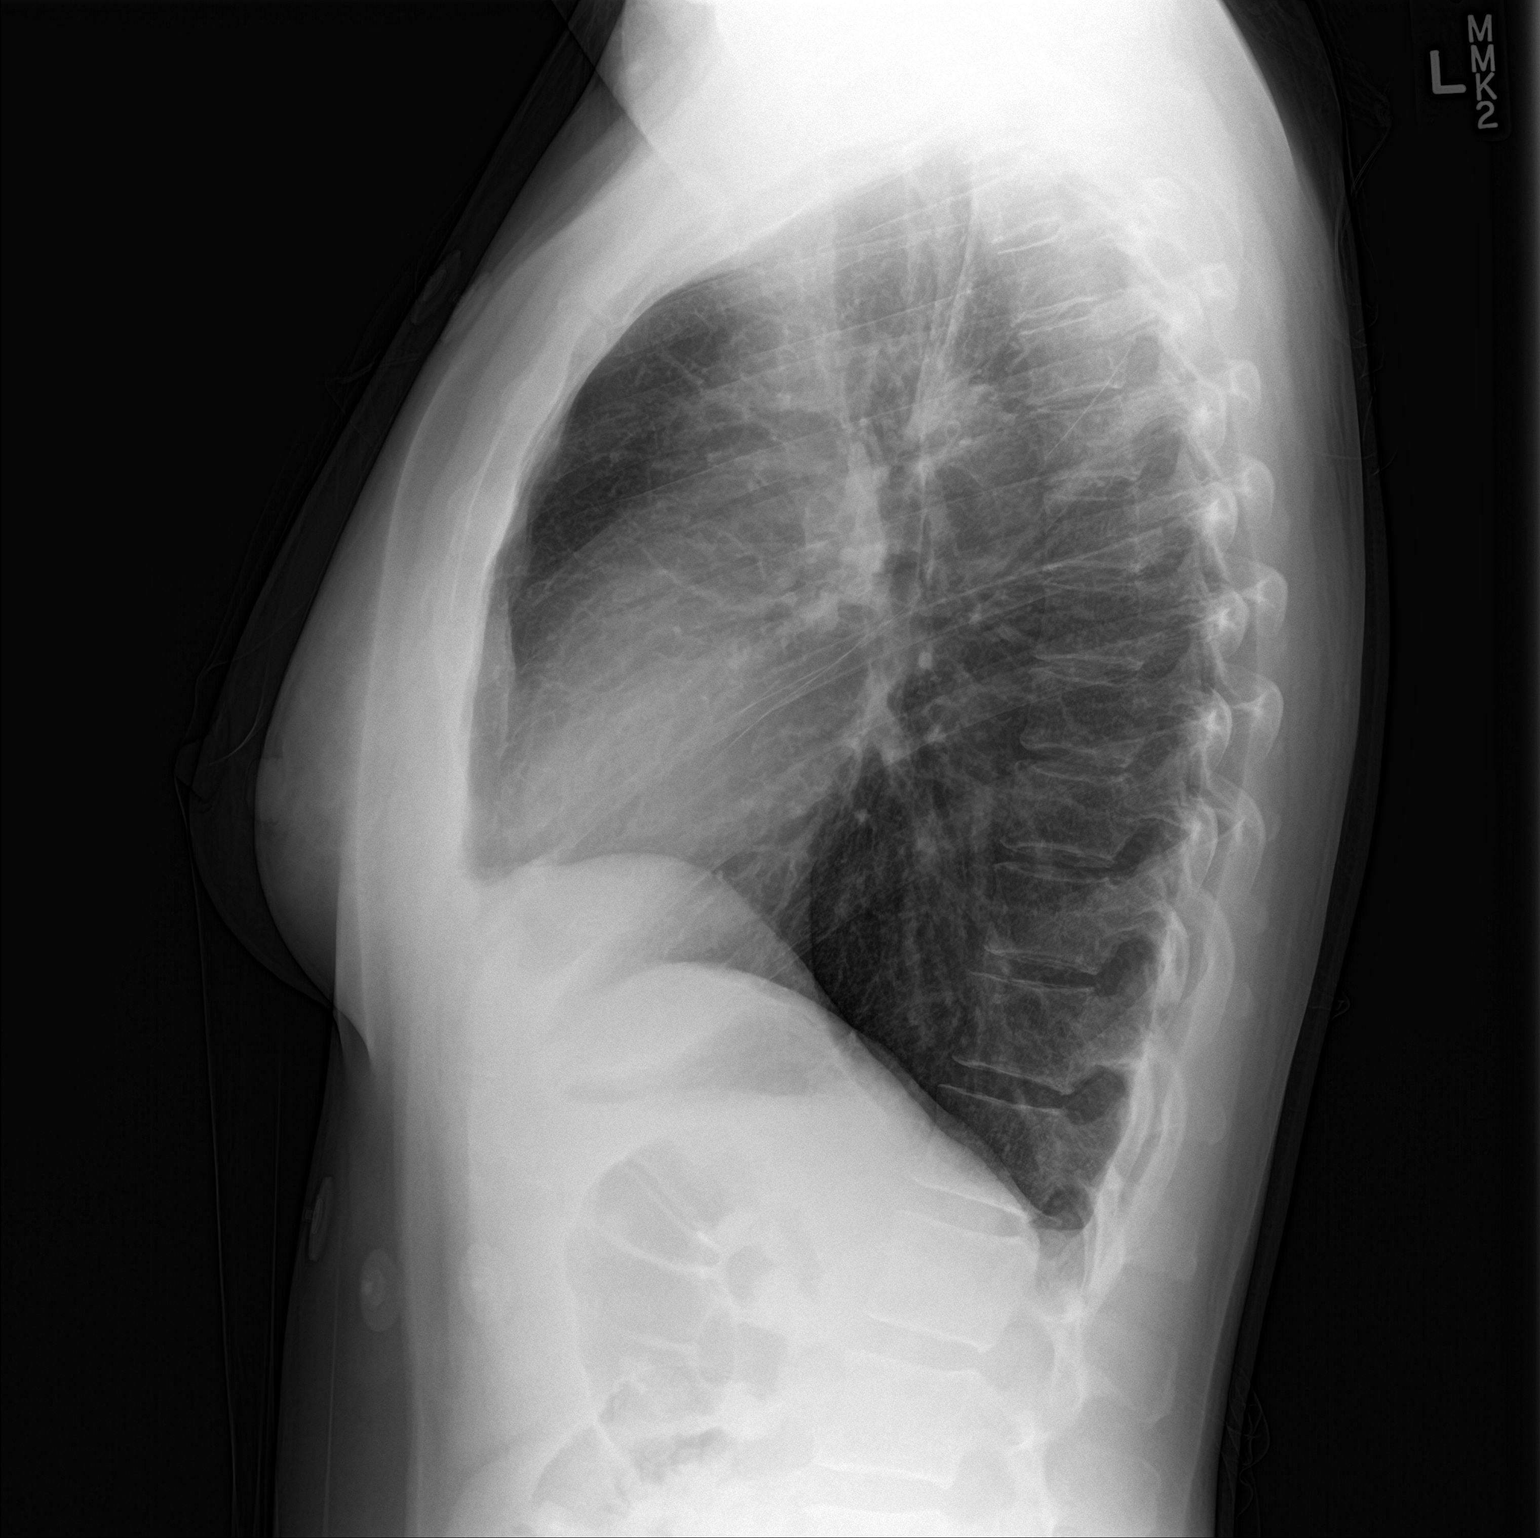

[2 of 2 positions shown; findings below may reference images not displayed]

FINDINGS: The heart size and mediastinal contours are within normal limits.
Both lungs are clear. The visualized skeletal structures are
unremarkable.
IMPRESSION: No active cardiopulmonary disease.

## 2017-09-01 IMAGING — CR DG CHEST 2V
2 series · 2 of 2 positions shown · non-contrast
Comparison: Chest radiograph performed 08/17/2015

CLINICAL DATA: Chronic tachycardia and upper mid chest pain.
Initial encounter.

EXAM:
CHEST  2 VIEW

[w chest pa]
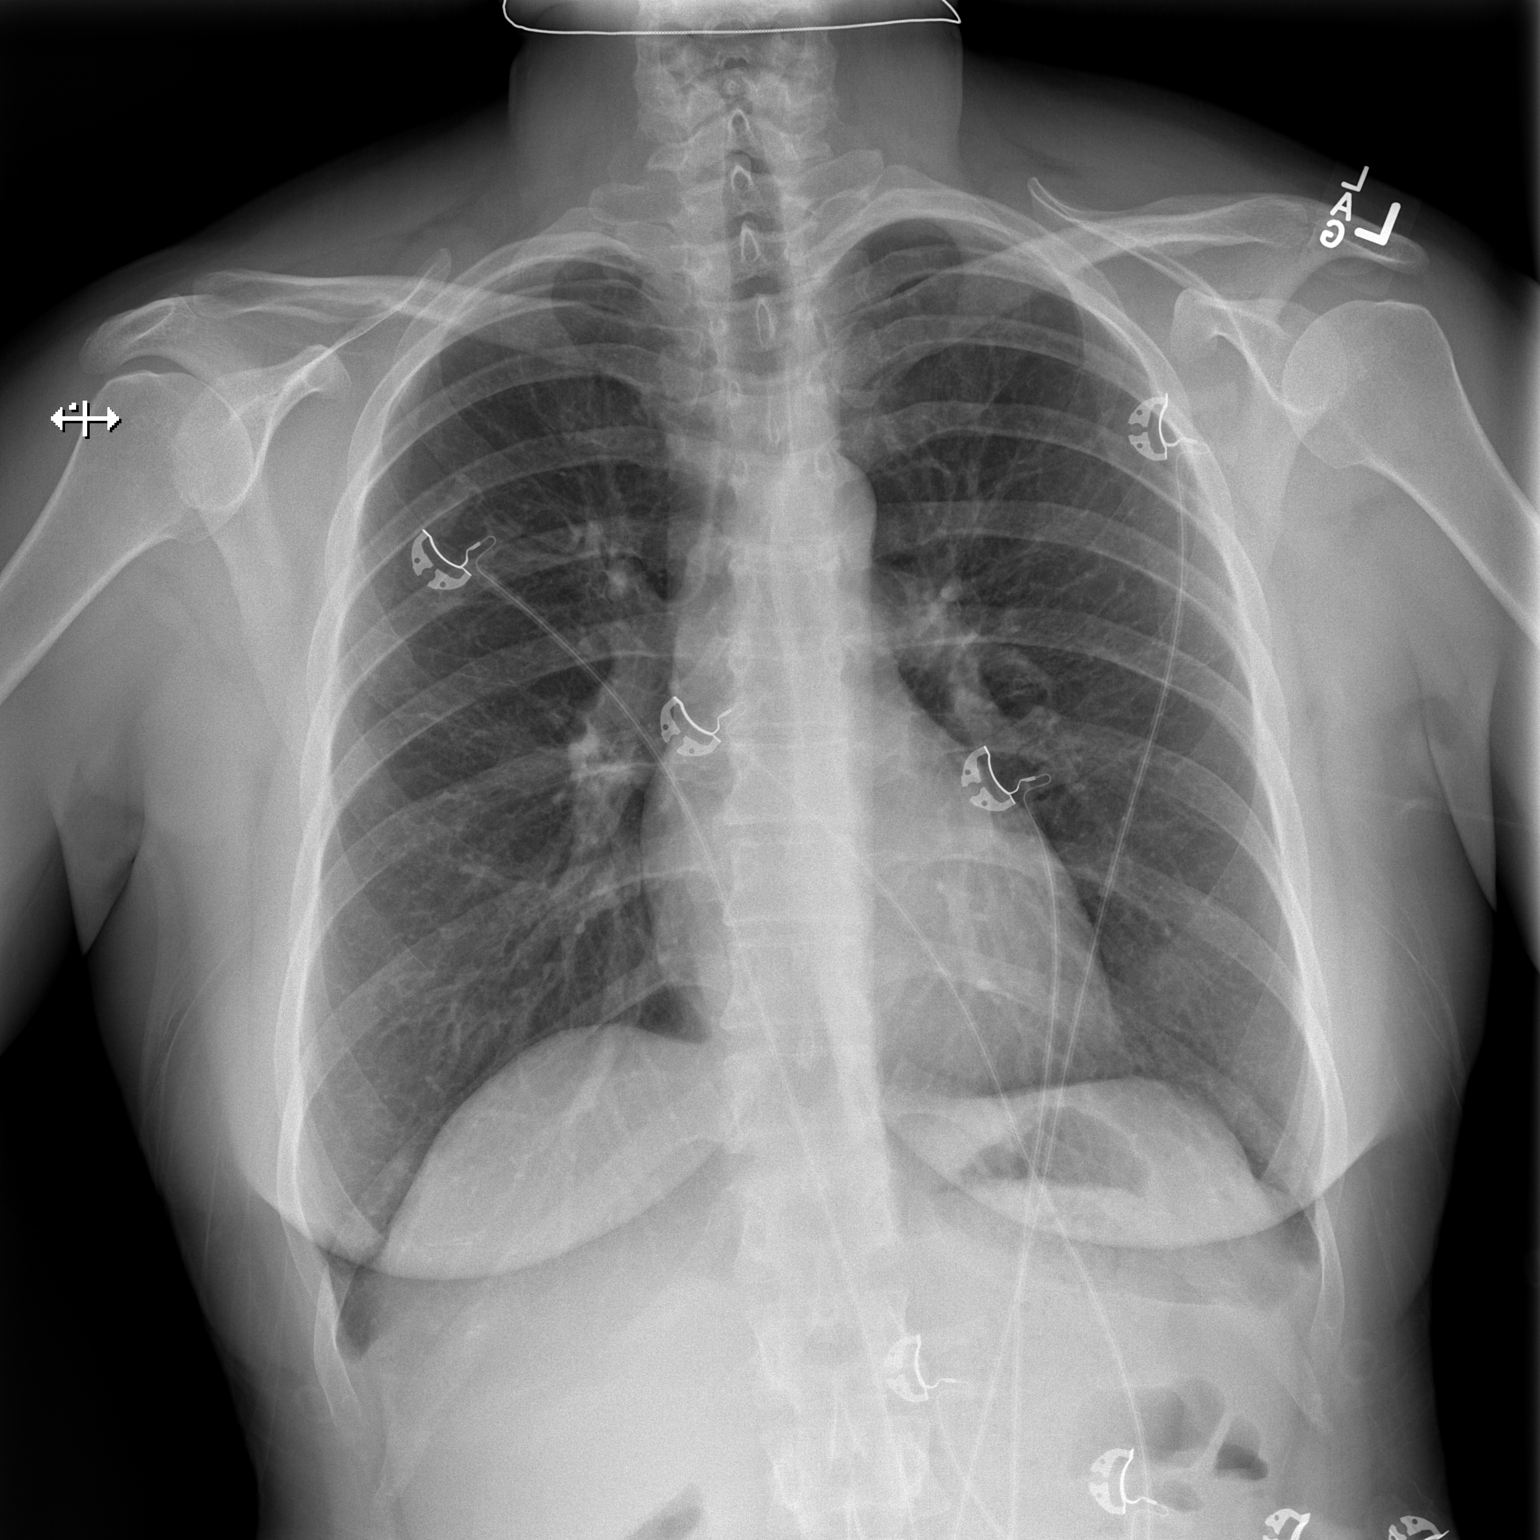

[w chest lat]
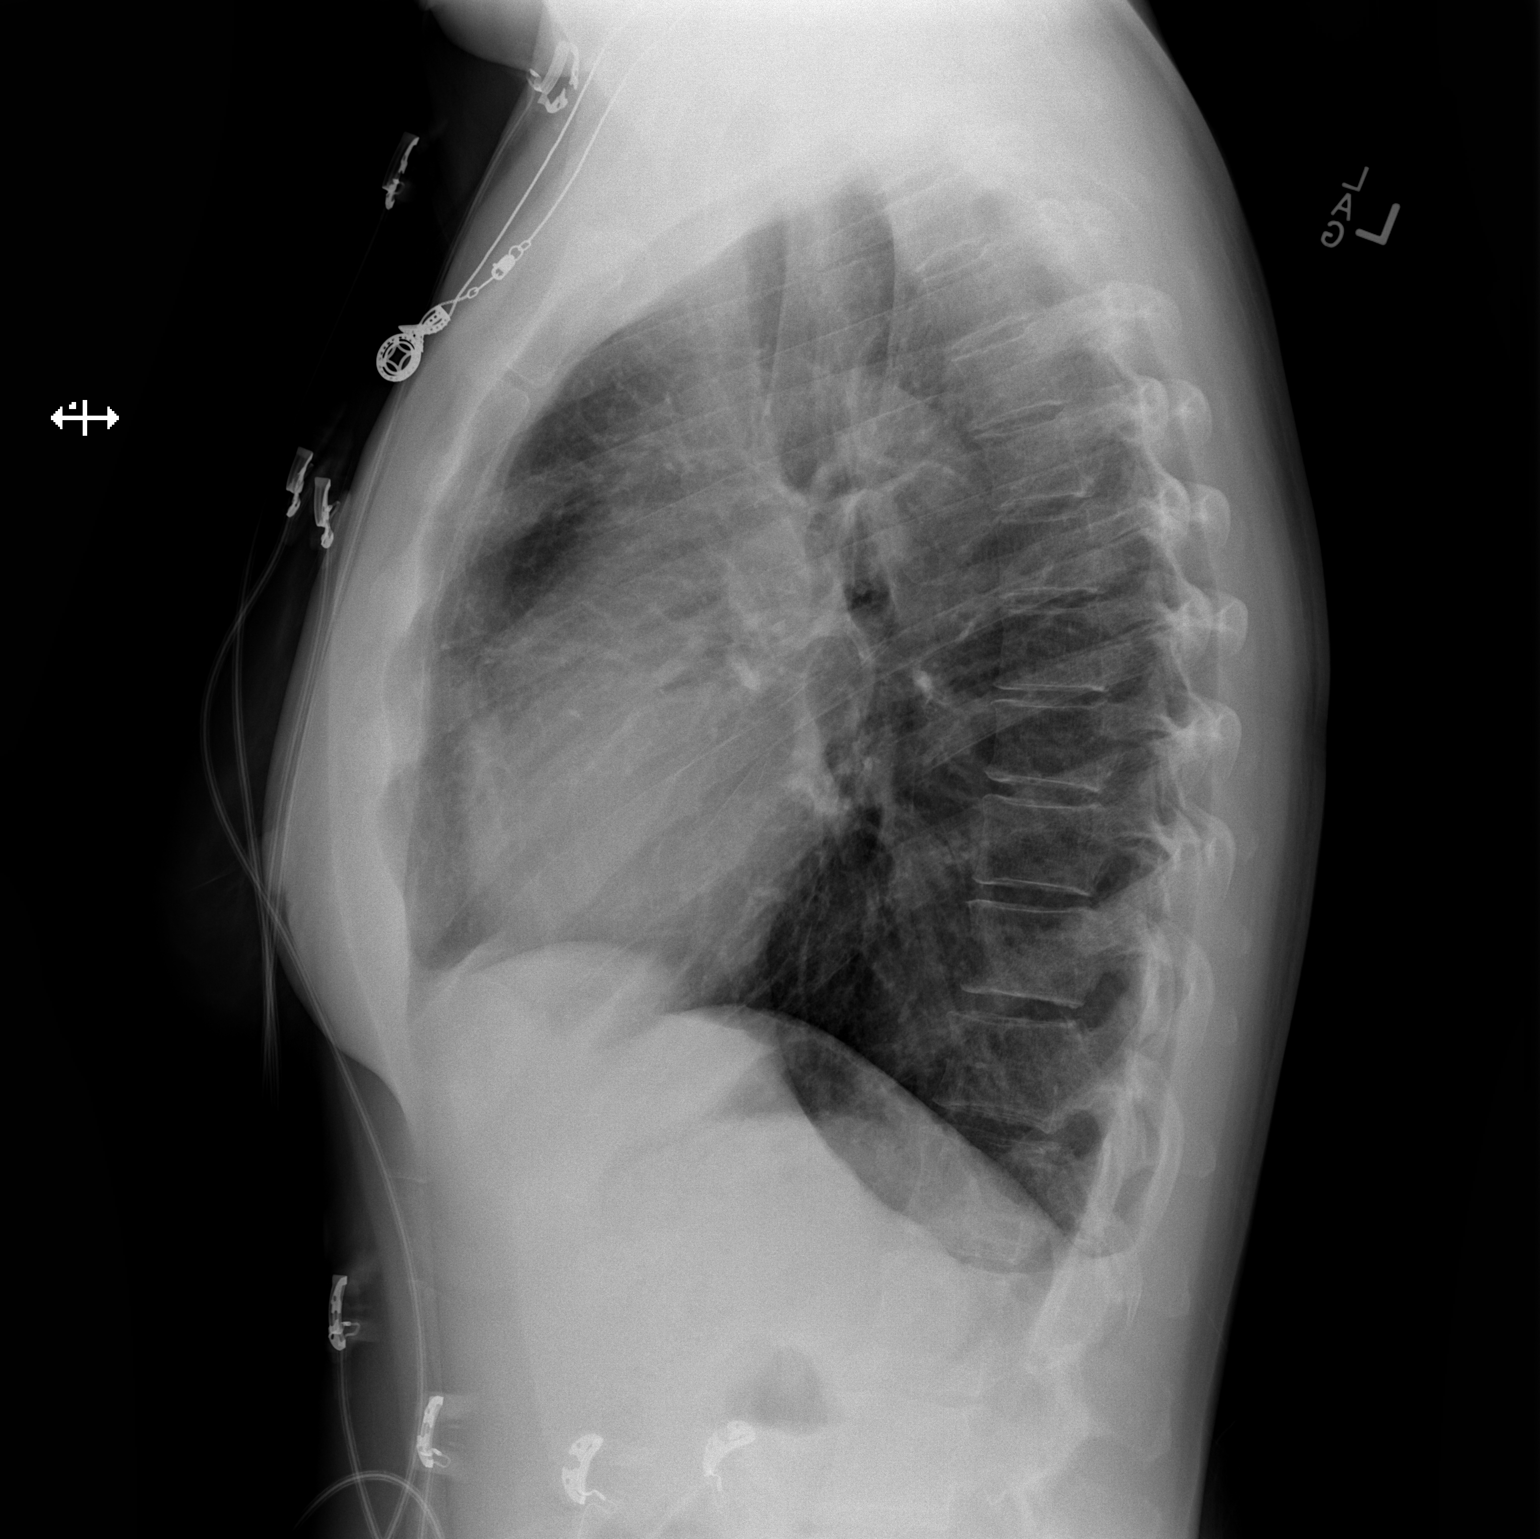

[2 of 2 positions shown; findings below may reference images not displayed]

FINDINGS: The lungs are well-aerated and clear. There is no evidence of focal
opacification, pleural effusion or pneumothorax.

The heart is normal in size; the mediastinal contour is within
normal limits. No acute osseous abnormalities are seen.
IMPRESSION: No acute cardiopulmonary process seen.
